# Patient Record
Sex: Female | Born: 1960 | Race: Black or African American | Hispanic: No | Marital: Single | Smoking: Never smoker
Health system: Southern US, Community
[De-identification: ages and names within clinical notes are randomized; demographics above are authoritative.]

## PROBLEM LIST (undated history)

## (undated) DIAGNOSIS — G459 Transient cerebral ischemic attack, unspecified: Secondary | ICD-10-CM

## (undated) DIAGNOSIS — D649 Anemia, unspecified: Secondary | ICD-10-CM

## (undated) DIAGNOSIS — I10 Essential (primary) hypertension: Secondary | ICD-10-CM

## (undated) HISTORY — PX: BREAST BIOPSY: SHX20

## (undated) HISTORY — PX: BREAST EXCISIONAL BIOPSY: SUR124

---

## 2004-02-11 ENCOUNTER — Ambulatory Visit: Payer: Self-pay

## 2005-02-23 ENCOUNTER — Ambulatory Visit: Payer: Self-pay

## 2006-03-08 ENCOUNTER — Ambulatory Visit: Payer: Self-pay

## 2006-12-04 ENCOUNTER — Ambulatory Visit: Payer: Self-pay | Admitting: Family Medicine

## 2007-02-28 ENCOUNTER — Ambulatory Visit: Payer: Self-pay

## 2008-04-02 ENCOUNTER — Emergency Department: Payer: Self-pay | Admitting: Internal Medicine

## 2008-05-27 ENCOUNTER — Ambulatory Visit: Payer: Self-pay

## 2009-09-01 ENCOUNTER — Ambulatory Visit: Payer: Self-pay

## 2010-09-06 ENCOUNTER — Ambulatory Visit: Payer: Self-pay | Admitting: Family Medicine

## 2013-06-09 ENCOUNTER — Emergency Department: Payer: Self-pay | Admitting: Emergency Medicine

## 2013-06-09 LAB — COMPREHENSIVE METABOLIC PANEL
Albumin: 4.1 g/dL (ref 3.4–5.0)
Alkaline Phosphatase: 108 U/L
Anion Gap: 7 (ref 7–16)
BUN: 11 mg/dL (ref 7–18)
Bilirubin,Total: 1.6 mg/dL — ABNORMAL HIGH (ref 0.2–1.0)
Calcium, Total: 9.3 mg/dL (ref 8.5–10.1)
Chloride: 107 mmol/L (ref 98–107)
Co2: 25 mmol/L (ref 21–32)
Creatinine: 1.12 mg/dL (ref 0.60–1.30)
EGFR (African American): >60
EGFR (Non-African Amer.): 56 — ABNORMAL LOW
Glucose: 133 mg/dL — ABNORMAL HIGH (ref 65–99)
Osmolality: 279 (ref 275–301)
Potassium: 3.7 mmol/L (ref 3.5–5.1)
SGOT(AST): 21 U/L (ref 15–37)
SGPT (ALT): 26 U/L (ref 12–78)
Sodium: 139 mmol/L (ref 136–145)
Total Protein: 8.2 g/dL (ref 6.4–8.2)

## 2013-06-09 LAB — CBC WITH DIFFERENTIAL/PLATELET
Basophil #: 0 10*3/uL (ref 0.0–0.1)
Basophil %: 0.2 %
Eosinophil #: 0.1 10*3/uL (ref 0.0–0.7)
Eosinophil %: 0.6 %
HCT: 40.7 % (ref 35.0–47.0)
HGB: 13.5 g/dL (ref 12.0–16.0)
Lymphocyte #: 0.4 10*3/uL — ABNORMAL LOW (ref 1.0–3.6)
Lymphocyte %: 4.2 %
MCH: 29.8 pg (ref 26.0–34.0)
MCHC: 33.2 g/dL (ref 32.0–36.0)
MCV: 90 fL (ref 80–100)
Monocyte #: 0.5 x10 3/mm (ref 0.2–0.9)
Monocyte %: 5.6 %
Neutrophil #: 7.7 10*3/uL — ABNORMAL HIGH (ref 1.4–6.5)
Neutrophil %: 89.4 %
Platelet: 213 10*3/uL (ref 150–440)
RBC: 4.54 10*6/uL (ref 3.80–5.20)
RDW: 12.9 % (ref 11.5–14.5)
WBC: 8.6 10*3/uL (ref 3.6–11.0)

## 2013-06-09 LAB — URINALYSIS, COMPLETE
Bilirubin,UR: NEGATIVE
Blood: NEGATIVE
Glucose,UR: NEGATIVE mg/dL (ref 0–75)
Ketone: NEGATIVE
Nitrite: NEGATIVE
Ph: 6 (ref 4.5–8.0)
Protein: 30
RBC,UR: 4 /HPF (ref 0–5)
Specific Gravity: 1.025 (ref 1.003–1.030)
Squamous Epithelial: 7
WBC UR: 2 /HPF (ref 0–5)

## 2013-06-09 LAB — LIPASE, BLOOD: Lipase: 147 U/L (ref 73–393)

## 2013-06-09 LAB — PREGNANCY, URINE: Pregnancy Test, Urine: NEGATIVE m[IU]/mL

## 2014-02-07 ENCOUNTER — Emergency Department: Payer: Self-pay | Admitting: Emergency Medicine

## 2014-02-13 ENCOUNTER — Emergency Department: Payer: Self-pay | Admitting: Student

## 2014-02-16 ENCOUNTER — Emergency Department: Payer: Self-pay | Admitting: Emergency Medicine

## 2015-10-31 ENCOUNTER — Encounter: Payer: Self-pay | Admitting: Emergency Medicine

## 2015-10-31 ENCOUNTER — Emergency Department: Payer: Self-pay

## 2015-10-31 ENCOUNTER — Emergency Department
Admission: EM | Admit: 2015-10-31 | Discharge: 2015-11-01 | Disposition: A | Discharge status: Home / Self Care | Payer: Self-pay | Attending: Emergency Medicine | Admitting: Emergency Medicine

## 2015-10-31 DIAGNOSIS — R112 Nausea with vomiting, unspecified: Secondary | ICD-10-CM | POA: Insufficient documentation

## 2015-10-31 LAB — COMPREHENSIVE METABOLIC PANEL
ALT: 15 U/L (ref 14–54)
AST: 30 U/L (ref 15–41)
Albumin: 4.2 g/dL (ref 3.5–5.0)
Alkaline Phosphatase: 78 U/L (ref 38–126)
Anion gap: 11 (ref 5–15)
BUN: 16 mg/dL (ref 6–20)
CO2: 19 mmol/L — ABNORMAL LOW (ref 22–32)
Calcium: 9 mg/dL (ref 8.9–10.3)
Chloride: 110 mmol/L (ref 101–111)
Creatinine, Ser: 1 mg/dL (ref 0.44–1.00)
GFR calc Af Amer: >60 mL/min (ref >60)
GFR calc non Af Amer: >60 mL/min (ref >60)
Glucose, Bld: 96 mg/dL (ref 65–99)
Potassium: 4.7 mmol/L (ref 3.5–5.1)
Sodium: 140 mmol/L (ref 135–145)
Total Bilirubin: 1.6 mg/dL — ABNORMAL HIGH (ref 0.3–1.2)
Total Protein: 7.1 g/dL (ref 6.5–8.1)

## 2015-10-31 LAB — URINALYSIS COMPLETE WITH MICROSCOPIC (ARMC ONLY)
Bacteria, UA: NONE SEEN
Bilirubin Urine: NEGATIVE
Glucose, UA: NEGATIVE mg/dL
Nitrite: NEGATIVE
Protein, ur: NEGATIVE mg/dL
Specific Gravity, Urine: 1.017 (ref 1.005–1.030)
pH: 5 (ref 5.0–8.0)

## 2015-10-31 LAB — CBC
HCT: 42.5 % (ref 35.0–47.0)
Hemoglobin: 14.3 g/dL (ref 12.0–16.0)
MCH: 30.4 pg (ref 26.0–34.0)
MCHC: 33.6 g/dL (ref 32.0–36.0)
MCV: 90.4 fL (ref 80.0–100.0)
Platelets: 172 10*3/uL (ref 150–440)
RBC: 4.7 MIL/uL (ref 3.80–5.20)
RDW: 13.4 % (ref 11.5–14.5)
WBC: 9.8 10*3/uL (ref 3.6–11.0)

## 2015-10-31 LAB — LIPASE, BLOOD: Lipase: 28 U/L (ref 11–51)

## 2015-10-31 MED ORDER — SODIUM CHLORIDE 0.9 % IV BOLUS (SEPSIS)
1000.0000 mL | Freq: Once | INTRAVENOUS | Status: AC
  Administered 2015-10-31: 1000 mL via INTRAVENOUS

## 2015-10-31 MED ORDER — ONDANSETRON HCL 4 MG/2ML IJ SOLN
4.0000 mg | Freq: Once | INTRAMUSCULAR | Status: AC
  Administered 2015-10-31: 4 mg via INTRAVENOUS

## 2015-10-31 MED ORDER — METOCLOPRAMIDE HCL 5 MG/ML IJ SOLN
INTRAMUSCULAR | Status: AC
  Administered 2015-10-31: 10 mg via INTRAVENOUS
  Filled 2015-10-31: qty 2

## 2015-10-31 MED ORDER — ONDANSETRON 4 MG PO TBDP: 4.0000 mg | ORAL_TABLET | Freq: Four times a day (QID) | ORAL | Status: DC | PRN

## 2015-10-31 MED ORDER — LORAZEPAM 2 MG/ML IJ SOLN
INTRAMUSCULAR | Status: AC
  Administered 2015-10-31: 0.5 mg via INTRAVENOUS
  Filled 2015-10-31: qty 1

## 2015-10-31 MED ORDER — ONDANSETRON HCL 4 MG/2ML IJ SOLN
INTRAMUSCULAR | Status: AC
  Administered 2015-10-31: 4 mg via INTRAVENOUS
  Filled 2015-10-31: qty 2

## 2015-10-31 MED ORDER — LORAZEPAM 2 MG/ML IJ SOLN
0.5000 mg | INTRAMUSCULAR | Status: AC
  Administered 2015-10-31: 0.5 mg via INTRAVENOUS

## 2015-10-31 MED ORDER — ONDANSETRON HCL 4 MG/2ML IJ SOLN
4.0000 mg | Freq: Once | INTRAMUSCULAR | Status: AC | PRN
  Administered 2015-10-31: 4 mg via INTRAVENOUS
  Filled 2015-10-31: qty 2

## 2015-10-31 MED ORDER — METOCLOPRAMIDE HCL 5 MG/ML IJ SOLN
10.0000 mg | Freq: Once | INTRAMUSCULAR | Status: AC
  Administered 2015-10-31: 10 mg via INTRAVENOUS

## 2015-10-31 NOTE — Discharge Instructions (Signed)
You were seen in the emergency room for vomiting. It is important that you follow up closely with your primary care doctor in the next couple of days.  If you're unable to see her primary care doctor you may return to the emergency room or go to the [facility]-in [facility] in 1 or 2 days for reexam.  Please return to the emergency room right away if you are to develop a fever, severe nausea, your pain becomes severe or worsens, you are unable to keep food down, begin vomiting any dark or bloody fluid, you develop any dark or bloody stools, feel dehydrated, or other new concerns or symptoms arise.   Nausea and Vomiting Nausea is a sick feeling that often comes before throwing up (vomiting). Vomiting is a reflex where stomach contents come out of your mouth. Vomiting can cause severe loss of body fluids (dehydration). Children and elderly adults can become dehydrated quickly, especially if they also have diarrhea. Nausea and vomiting are symptoms of a condition or disease. It is important to find the cause of your symptoms. CAUSES   Direct irritation of the stomach lining. This irritation can result from increased acid production (gastroesophageal reflux disease), infection, food poisoning, taking certain medicines (such as nonsteroidal anti-inflammatory drugs), alcohol use, or tobacco use.  Signals from the brain.These signals could be caused by a headache, heat exposure, an inner ear disturbance, increased pressure in the brain from injury, infection, a tumor, or a concussion, pain, emotional stimulus, or metabolic problems.  An obstruction in the gastrointestinal tract (bowel obstruction).  Illnesses such as diabetes, hepatitis, gallbladder problems, appendicitis, kidney problems, cancer, sepsis, atypical symptoms of a heart attack, or eating disorders.  Medical treatments such as chemotherapy and radiation.  Receiving medicine that makes you sleep (general anesthetic) during  surgery. DIAGNOSIS Your caregiver may ask for tests to be done if the problems do not improve after a few days. Tests may also be done if symptoms are severe or if the reason for the nausea and vomiting is not clear. Tests may include:  Urine tests.  Blood tests.  Stool tests.  Cultures (to look for evidence of infection).  X-rays or other imaging studies. Test results can help your caregiver make decisions about treatment or the need for additional tests. TREATMENT You need to stay well hydrated. Drink frequently but in small amounts.You may wish to drink water, sports drinks, clear broth, or eat frozen ice pops or gelatin dessert to help stay hydrated.When you eat, eating slowly may help prevent nausea.There are also some antinausea medicines that may help prevent nausea. HOME CARE INSTRUCTIONS   Take all medicine as directed by your caregiver.  If you do not have an appetite, do not force yourself to eat. However, you must continue to drink fluids.  If you have an appetite, eat a normal diet unless your caregiver tells you differently.  Eat a variety of complex carbohydrates (rice, wheat, potatoes, bread), lean meats, yogurt, fruits, and vegetables.  Avoid high-fat foods because they are more difficult to digest.  Drink enough water and fluids to keep your urine clear or pale yellow.  If you are dehydrated, ask your caregiver for specific rehydration instructions. Signs of dehydration may include:  Severe thirst.  Dry lips and mouth.  Dizziness.  Dark urine.  Decreasing urine frequency and amount.  Confusion.  Rapid breathing or pulse. SEEK IMMEDIATE MEDICAL CARE IF:   You have blood or brown flecks (like coffee grounds) in your vomit.  You  have black or bloody stools.  You have a severe headache or stiff neck.  You are confused.  You have severe abdominal pain.  You have chest pain or trouble breathing.  You do not urinate at least once every 8  hours.  You develop cold or clammy skin.  You continue to vomit for longer than 24 to 48 hours.  You have a fever. MAKE SURE YOU:   Understand these instructions.  Will watch your condition.  Will get help right away if you are not doing well or get worse.   This information is not intended to replace advice given to you by your health care provider. Make sure you discuss any questions you have with your health care provider.   Document Released: 03/28/2005 Document Revised: 06/20/2011 Document Reviewed: 08/25/2010 Elsevier Interactive Patient Education [employer]

## 2015-10-31 NOTE — ED Provider Notes (Signed)
[facility] Emergency Department Provider Note  ____________________________________________  Time seen: Approximately 8:37 PM  I have reviewed the triage vital signs and the nursing notes.   HISTORY  Chief Complaint Emesis    HPI Lauren Riggs is a 56 y.o. female reports no significant medical history.  Patient reports that she went to talk about today, she didn't ever talk ago and thought it tasted strange so she returned it. About half hour later she started vomiting nonstop.  She reports that she does not have any pain, but just was feeling very very nauseated. Her symptoms are improving now, and she denies any chest pain or trouble breathing. She states that she is vomiting up stomach contents and water. No diarrhea, still able to pass gas normally.  Currently reports minimal nausea, states feeling much better overall. She does feel "dehydrated".   History reviewed. No pertinent past medical history.  There are no active problems to display for this patient.   No past surgical history on file.  Current Outpatient Rx  Name  Route  Sig  Dispense  Refill  . ondansetron (ZOFRAN ODT) 4 MG disintegrating tablet   Oral   Take 1 tablet (4 mg total) by mouth every 6 (six) hours as needed for nausea or vomiting.   20 tablet   0     Allergies Phenergan  No family history on file.  Social History Social History  Substance Use Topics  . Smoking status: Never Smoker   . Smokeless tobacco: Never Used  . Alcohol Use: No    Review of Systems Constitutional: No fever/chills Eyes: No visual changes. ENT: No sore throat. Cardiovascular: Denies chest pain. Respiratory: Denies shortness of breath. Gastrointestinal: No abdominal pain.    No diarrhea.  No constipation. Genitourinary: Negative for dysuria. Musculoskeletal: Negative for back pain. Skin: Negative for rash. Neurological: Negative for headaches, focal weakness or  numbness.  10-point ROS otherwise negative.  ____________________________________________   PHYSICAL EXAM:  VITAL SIGNS: ED Triage Vitals  Enc Vitals Group     BP 10/31/15 1700 159/90 mmHg     Pulse Rate 10/31/15 1700 125     Resp 10/31/15 1700 22     Temp 10/31/15 1700 97.6 F (36.4 C)     Temp Source 10/31/15 1700 Axillary     SpO2 --      Weight 10/31/15 1700 150 lb (68.04 kg)     Height 10/31/15 1700 5\' 4"  (1.626 m)     Head Cir --      Peak Flow --      Pain Score 10/31/15 1700 0     Pain Loc --      Pain Edu? --      Excl. in GC? --    Constitutional: Alert and oriented. Well appearing and in no acute distress.Pleasant. Eyes: Conjunctivae are normal. PERRL. EOMI. Head: Atraumatic. Nose: No congestion/rhinnorhea. Mouth/Throat: Mucous membranes are dry, patient is eating a few ice chips.  Oropharynx non-erythematous. Neck: No stridor.   Cardiovascular: Normal rate, regular rhythm. Grossly normal heart sounds.  Good peripheral circulation. Respiratory: Normal respiratory effort.  No retractions. Lungs CTAB. Gastrointestinal: Soft and nontender. No distention. No distention, negative Murphy, no pain at McBurney's point, no hernias. Patient reports she is having no abdominal pain at this time. Musculoskeletal: No lower extremity tenderness nor edema.  No joint effusions. Neurologic:  Normal speech and language. No gross focal neurologic deficits are appreciated. Skin:  Skin is warm, dry and intact.  No rash noted. Psychiatric: Mood and affect are normal. Speech and behavior are normal.  ____________________________________________   LABS (all labs ordered are listed, but only abnormal results are displayed)  Labs Reviewed  URINALYSIS COMPLETEWITH MICROSCOPIC ([facility] ONLY) - Abnormal; Notable for the following:    Color, Urine YELLOW (*)    APPearance CLEAR (*)    Ketones, ur TRACE (*)    Hgb urine dipstick 1+ (*)    Leukocytes, UA 1+ (*)    Squamous Epithelial / LPF  0-5 (*)    All other components within normal limits  COMPREHENSIVE METABOLIC PANEL - Abnormal; Notable for the following:    CO2 19 (*)    Total Bilirubin 1.6 (*)    All other components within normal limits  CBC  LIPASE, BLOOD   ____________________________________________  EKG  Reviewed and read by me at 2045 Heart rate 90 QRS 90 QTc 420 Normal sinus rhythm, nonspecific T wave abnormality seen in V3, no significant evidence of ischemic abnormality noted.  Note the patient affirms no chest pain. ____________________________________________  RADIOLOGY  DG Abd 2 Views (Final result) Result time: 10/31/15 21:04:39   Final result by Rad Results In Interface (10/31/15 21:04:39)   Narrative:   CLINICAL DATA: Vomiting after eating today.  EXAM: ABDOMEN - 2 VIEW  COMPARISON: None.  FINDINGS: There is no evidence of intraperitoneal free air. Gas and an ordinary amount of stool are present throughout nondilated colon to the level of the rectum. No dilated loops of bowel are seen to suggest obstruction. Tubal ligation clips are noted in the pelvis. Pelvic phleboliths are present. No acute osseous abnormality is seen. The visualized lung bases are grossly clear.  IMPRESSION: Negative.   Electronically Signed By: Sebastian Ache M.D. On: 10/31/2015 21:04    ____________________________________________   PROCEDURES  Procedure(s) performed: None  Critical Care performed: No  ____________________________________________   INITIAL IMPRESSION / ASSESSMENT AND PLAN / ED COURSE  Pertinent labs & imaging results that were available during my care of the patient were reviewed by me and considered in my medical decision making (see chart for details).  Patient answer evaluation of vomiting. Her history seems to be that of having eaten some potentially bad [place], she reported tasted funny and return it to the restaurant and then about 30 minutes later started  vomiting. At the present time she reports her symptoms are better after Zofran and some hydration. She actually feels like she did try drinking some. Overall her abdominal examination is very reassuring. Afebrile, no neurologic cardiac or pulmonary complaints. Labs reviewed and her CO2 is slightly low likely from vomiting, particularly as likely also slightly elevated from vomiting she reports no bowel pain is a negative Murphy and no abdominal pain noted on exam.  We'll give the patient Zofran, continue to hydrate her, plan to reevaluate. I will obtain an x-ray just to rule out bowel obstruction by final likelihood is low.  ----------------------------------------- 11:44 PM on 10/31/2015 -----------------------------------------  Patient feeling improved. She feels very tired after receiving Ativan and Reglan. She does report that she is not having any more nausea and she is no longer vomiting. She is quite somnolent however, but resting comfortably with stable vital signs. We will continue to monitor her, plan is to observe her for about another hour, and if improved once alert and feeling well likely discharge to home. Discussed careful return precautions.  Ongoing plan of care assigned to Dr. Lenard Lance. Reevaluate the patient about one hour for improvement in  her nausea, we continue to observe her. Her vital signs are much improved and I suspect she likely had food poisoning or self limited gastritis, however we'll give her careful return precautions. Continue to monitor as she has become fairly somnolent after receiving Ativan and Reglan, however her nausea improved rapidly. ____________________________________________   FINAL CLINICAL IMPRESSION(S) / ED DIAGNOSES  Final diagnoses:  Non-intractable vomiting with nausea, vomiting of unspecified type      Sharyn Creamer, MD 10/31/15 2346

## 2015-10-31 NOTE — ED Notes (Signed)
States went to [employer] today at noon and ate a taco and has been vomiting ever since.

## 2015-11-01 MED ORDER — LOPERAMIDE HCL 2 MG PO CAPS
4.0000 mg | ORAL_CAPSULE | Freq: Once | ORAL | Status: AC
  Administered 2015-11-01: 4 mg via ORAL
  Filled 2015-11-01: qty 2

## 2015-11-01 NOTE — ED Notes (Signed)
See paper charting due to downtime  

## 2016-06-19 ENCOUNTER — Encounter: Payer: Self-pay | Admitting: Emergency Medicine

## 2016-06-19 DIAGNOSIS — R232 Flushing: Secondary | ICD-10-CM | POA: Insufficient documentation

## 2016-06-19 NOTE — ED Triage Notes (Signed)
Patient brought in by ems from home. Patient has elevated blood pressure after eating pork tonight. Patient with a history of the same after pork. Per ems bp 182/105, heart rate 69 SR, 100% on room air and fsbs 86.

## 2016-06-20 ENCOUNTER — Emergency Department
Admission: EM | Admit: 2016-06-20 | Discharge: 2016-06-20 | Disposition: A | Discharge status: Home / Self Care | Payer: Self-pay | Attending: Emergency Medicine | Admitting: Emergency Medicine

## 2016-06-20 DIAGNOSIS — R232 Flushing: Secondary | ICD-10-CM

## 2016-06-20 NOTE — ED Provider Notes (Signed)
[facility] Emergency Department Provider Note  ____________________________________________  Time seen: Approximately 12:47 AM  I have reviewed the triage vital signs and the nursing notes.   HISTORY  Chief Complaint Hypertension    HPI Lauren Riggs is a 56 y.o. female who reports elevated blood pressure hot flash dizziness and near syncope today after eating some pork. This is happened to her once before after eating pork. It last about 15 minutes and then resolved. EMS reported an initial blood pressure 180/100. Patient states she is feeling much better, blood pressure is 150/80 now. She feels like she is back to normal. She does report that her diet is very poor and she mostly's potato chips and other processed foods. I counseled her on this. She'll follow-up with her primary care doctor as well. She wishes to be discharged from the emergency Department now because she feels just fine.     History reviewed. No pertinent past medical history.   There are no active problems to display for this patient.    History reviewed. No pertinent surgical history.   Prior to Admission medications   Medication Sig Start Date End Date Taking? Authorizing Provider  ondansetron (ZOFRAN ODT) 4 MG disintegrating tablet Take 1 tablet (4 mg total) by mouth every 6 (six) hours as needed for nausea or vomiting. 10/31/15   Sharyn Creamer, MD     Allergies Phenergan [promethazine hcl]   No family history on file.  Social History Social History  Substance Use Topics  . Smoking status: Never Smoker  . Smokeless tobacco: Never Used  . Alcohol use No    Review of Systems  Constitutional:   No fever or chills.  ENT:   No sore throat. No rhinorrhea. Cardiovascular:   No chest pain. Respiratory:   No dyspnea or cough. Gastrointestinal:   Negative for abdominal pain, vomiting and diarrhea.  Genitourinary:   Negative for dysuria or difficulty  urinating. Musculoskeletal:   Negative for focal pain or swelling Neurological:   Negative for headaches 10-point ROS otherwise negative.  ____________________________________________   PHYSICAL EXAM:  VITAL SIGNS: ED Triage Vitals [06/19/16 2308]  Enc Vitals Group     BP (!) 154/91     Pulse Rate 64     Resp 18     Temp 97.7 F (36.5 C)     Temp Source Oral     SpO2 98 %     Weight 145 lb (65.8 kg)     Height 5\' 4"  (1.626 m)     Head Circumference      Peak Flow      Pain Score      Pain Loc      Pain Edu?      Excl. in GC?     Vital signs reviewed, nursing assessments reviewed.   Constitutional:   Alert and oriented. Well appearing and in no distress. Eyes:   No scleral icterus. No conjunctival pallor. PERRL. EOMI.  No nystagmus. ENT   Head:   Normocephalic and atraumatic.   Nose:   No congestion/rhinnorhea. No septal hematoma   Mouth/Throat:   MMM, no pharyngeal erythema. No peritonsillar mass.    Neck:   No stridor. No SubQ emphysema. No meningismus. Hematological/Lymphatic/Immunilogical:   No cervical lymphadenopathy. Cardiovascular:   RRR. Symmetric bilateral radial and DP pulses.  No murmurs.  Respiratory:   Normal respiratory effort without tachypnea nor retractions. Breath sounds are clear and equal bilaterally. No wheezes/rales/rhonchi. Gastrointestinal:   Soft and  nontender. Non distended. There is no CVA tenderness.  No rebound, rigidity, or guarding. Genitourinary:   deferred Musculoskeletal:   Normal range of motion in all extremities. No joint effusions.  No lower extremity tenderness.  No edema. Neurologic:   Normal speech and language.  CN 2-10 normal. Motor grossly intact. No gross focal neurologic deficits are appreciated.  Skin:    Skin is warm, dry and intact. No rash noted.  No petechiae, purpura, or bullae.  ____________________________________________    LABS (pertinent positives/negatives) (all labs ordered are listed, but  only abnormal results are displayed) Labs Reviewed - No data to display ____________________________________________   EKG    ____________________________________________    RADIOLOGY  No results found.  ____________________________________________   PROCEDURES Procedures  ____________________________________________   INITIAL IMPRESSION / ASSESSMENT AND PLAN / ED COURSE  Pertinent labs & imaging results that were available during my care of the patient were reviewed by me and considered in my medical decision making (see chart for details).  Well-appearing no acute distress, presents with an episode of feeling hot and flushed and dizzy after eating pork. Likely a foodborne toxin exposure. She is well-appearing no acute distress at baseline. We'll discharge her have her follow-up with primary care for recheck of her blood pressure. Counseled her on diet modification. Return precautions given.         ____________________________________________   FINAL CLINICAL IMPRESSION(S) / ED DIAGNOSES  Final diagnoses:  Flushing reaction      New Prescriptions   No medications on file     Portions of this note were generated with dragon dictation software. Dictation errors may occur despite best attempts at proofreading.    Sharman CheekPhillip Aquarius Tremper, MD 06/20/16 [phone]

## 2016-06-20 NOTE — ED Notes (Signed)
Patient had episode of hot flash, dizziness and near syncope today after eating pork. Pt reports similar episodes in the past. Pt denies current symptoms.

## 2016-06-22 ENCOUNTER — Encounter: Payer: Self-pay | Admitting: *Deleted

## 2016-06-22 ENCOUNTER — Ambulatory Visit: Payer: Self-pay | Attending: Oncology | Admitting: *Deleted

## 2016-06-22 ENCOUNTER — Ambulatory Visit
Admission: RE | Admit: 2016-06-22 | Discharge: 2016-06-22 | Disposition: A | Discharge status: Home / Self Care | Payer: Self-pay | Source: Ambulatory Visit | Attending: Oncology | Admitting: Oncology

## 2016-06-22 VITALS — BP 136/74 | HR 64 | Temp 97.4°F | Ht 63.78 in | Wt 144.2 lb

## 2016-06-22 DIAGNOSIS — Z0000 Encounter for general adult medical examination without abnormal findings: Secondary | ICD-10-CM

## 2016-06-22 NOTE — Progress Notes (Signed)
Subjective:     Patient ID: Lauren Riggs, female   DOB: 01-Nov-1960, 56 y.o.   MRN: [id number]  HPI   Review of Systems     Objective:   Physical Exam  Pulmonary/Chest:         Assessment:     56 year [facility] Black [facility].  Clinical breast exam unremarkable.  Taught self breast awareness.  Last pap was one week ago.  Patient has been screened for eligibility.  She does not have any insurance, Medicare or Medicaid.  She also meets financial eligibility.  Hand-out given on the Affordable Care Act.    Plan:     Screening mammogram ordered.  Will follow-up per [facility] protocol.

## 2016-06-22 NOTE — Patient Instructions (Signed)
Gave patient hand-out, Women Staying Healthy, Active and Well from [facility], with education on breast health, pap smears, heart and colon health. 

## 2016-06-29 ENCOUNTER — Encounter: Payer: Self-pay | Admitting: *Deleted

## 2016-06-29 NOTE — Progress Notes (Signed)
Letter mailed from the [facility] to inform patient of her normal mammogram results.  Patient is to follow-up with annual screening in one year.  [facility] to Christy. 

## 2017-09-18 ENCOUNTER — Ambulatory Visit: Payer: Self-pay | Attending: Oncology

## 2017-09-18 ENCOUNTER — Encounter (INDEPENDENT_AMBULATORY_CARE_PROVIDER_SITE_OTHER): Payer: Self-pay

## 2017-09-18 ENCOUNTER — Ambulatory Visit
Admission: RE | Admit: 2017-09-18 | Discharge: 2017-09-18 | Disposition: A | Discharge status: Home / Self Care | Payer: Self-pay | Source: Ambulatory Visit | Attending: Oncology | Admitting: Oncology

## 2017-09-18 VITALS — BP 132/84 | HR 54 | Temp 97.7°F | Ht 65.0 in | Wt 146.0 lb

## 2017-09-18 DIAGNOSIS — Z0000 Encounter for general adult medical examination without abnormal findings: Secondary | ICD-10-CM | POA: Insufficient documentation

## 2017-09-18 NOTE — Progress Notes (Signed)
  Subjective:     Patient ID: Lauren Riggs, female   DOB: Apr 05, 1961, 57 y.o.   MRN: [id number]  HPI   Review of Systems     Objective:   Physical Exam  Pulmonary/Chest: Right breast exhibits no inverted nipple, no mass, no nipple discharge, no skin change and no tenderness. Left breast exhibits no inverted nipple, no mass, no nipple discharge, no skin change and no tenderness. Breasts are symmetrical.       Assessment:     57 [facility].  Patient screened, and meets [facility] eligibility.  Patient does not have insurance, Medicare or Medicaid.  Handout given on Affordable Care Act.   Instructed patient on breast self awareness using teach back method.  Clinical breast exam unremarkable.  No mass or lump palpated.  [place]  Went to [employer], and now has grandchild at the same school.    Plan:     Sent for bilateral screening mammogram.

## 2017-09-18 NOTE — Progress Notes (Signed)
Sent for bilateral screening mammogram.  Letter mailed from [facility] to notify of normal mammogram results.  Patient to return in one year for annual screening.  Copy to [facility]IS. 

## 2017-12-02 ENCOUNTER — Emergency Department: Payer: Self-pay

## 2017-12-02 ENCOUNTER — Other Ambulatory Visit: Payer: Self-pay

## 2017-12-02 ENCOUNTER — Emergency Department
Admission: EM | Admit: 2017-12-02 | Discharge: 2017-12-02 | Disposition: A | Discharge status: Home / Self Care | Payer: Self-pay | Attending: Emergency Medicine | Admitting: Emergency Medicine

## 2017-12-02 DIAGNOSIS — G459 Transient cerebral ischemic attack, unspecified: Secondary | ICD-10-CM | POA: Insufficient documentation

## 2017-12-02 DIAGNOSIS — Z79899 Other long term (current) drug therapy: Secondary | ICD-10-CM | POA: Insufficient documentation

## 2017-12-02 LAB — URINALYSIS, COMPLETE (UACMP) WITH MICROSCOPIC
Bilirubin Urine: NEGATIVE
Glucose, UA: NEGATIVE mg/dL
Ketones, ur: NEGATIVE mg/dL
Nitrite: NEGATIVE
Protein, ur: NEGATIVE mg/dL
Specific Gravity, Urine: 1.004 — ABNORMAL LOW (ref 1.005–1.030)
pH: 7 (ref 5.0–8.0)

## 2017-12-02 LAB — COMPREHENSIVE METABOLIC PANEL
ALT: 17 U/L (ref 0–44)
AST: 29 U/L (ref 15–41)
Albumin: 4.5 g/dL (ref 3.5–5.0)
Alkaline Phosphatase: 92 U/L (ref 38–126)
Anion gap: 7 (ref 5–15)
BUN: 12 mg/dL (ref 6–20)
CO2: 25 mmol/L (ref 22–32)
Calcium: 9.4 mg/dL (ref 8.9–10.3)
Chloride: 107 mmol/L (ref 98–111)
Creatinine, Ser: 0.93 mg/dL (ref 0.44–1.00)
GFR calc Af Amer: >60 mL/min (ref >60)
GFR calc non Af Amer: >60 mL/min (ref >60)
Glucose, Bld: 96 mg/dL (ref 70–99)
Potassium: 4.3 mmol/L (ref 3.5–5.1)
Sodium: 139 mmol/L (ref 135–145)
Total Bilirubin: 1.1 mg/dL (ref 0.3–1.2)
Total Protein: 7.8 g/dL (ref 6.5–8.1)

## 2017-12-02 LAB — CBC
HCT: 39.2 % (ref 35.0–47.0)
Hemoglobin: 13.2 g/dL (ref 12.0–16.0)
MCH: 30.9 pg (ref 26.0–34.0)
MCHC: 33.7 g/dL (ref 32.0–36.0)
MCV: 91.7 fL (ref 80.0–100.0)
Platelets: 258 10*3/uL (ref 150–440)
RBC: 4.28 MIL/uL (ref 3.80–5.20)
RDW: 13.3 % (ref 11.5–14.5)
WBC: 6.9 10*3/uL (ref 3.6–11.0)

## 2017-12-02 LAB — TROPONIN I: Troponin I: <0.03 ng/mL (ref <0.03)

## 2017-12-02 MED ORDER — HYDROCHLOROTHIAZIDE 12.5 MG PO CAPS: 12.5000 mg | ORAL_CAPSULE | Freq: Every day | ORAL | 0 refills | Status: DC

## 2017-12-02 MED ORDER — ASPIRIN 81 MG PO CHEW
324.0000 mg | CHEWABLE_TABLET | Freq: Once | ORAL | Status: AC
  Administered 2017-12-02: 324 mg via ORAL
  Filled 2017-12-02: qty 4

## 2017-12-02 NOTE — Discharge Instructions (Signed)
Please begin taking a baby aspirin every day and take your blood pressure medication as prescribed.  Follow-up with your primary care physician this coming week for a recheck and return to the emergency department for any concerns whatsoever.  It was a pleasure to take care of you today, and thank you for coming to our emergency department.  If you have any questions or concerns before leaving please ask the nurse to grab me and I'm more than happy to go through your aftercare instructions again.  If you were prescribed any opioid pain medication today such as Norco, Vicodin, Percocet, morphine, hydrocodone, or oxycodone please make sure you do not drive when you are taking this medication as it can alter your ability to drive safely.  If you have any concerns once you are home that you are not improving or are in fact getting worse before you can make it to your follow-up appointment, please do not hesitate to call 911 and come back for further evaluation.  Merrily Brittle, MD  Results for orders placed or performed during the hospital encounter of 12/02/17  CBC  Result Value Ref Range   WBC 6.9 3.6 - 11.0 K/uL   RBC 4.28 3.80 - 5.20 MIL/uL   Hemoglobin 13.2 12.0 - 16.0 g/dL   HCT 16.1 09.6 - 04.5 %   MCV 91.7 80.0 - 100.0 fL   MCH 30.9 26.0 - 34.0 pg   MCHC 33.7 32.0 - 36.0 g/dL   RDW 40.9 81.1 - 91.4 %   Platelets 258 150 - 440 K/uL  Urinalysis, Complete w Microscopic  Result Value Ref Range   Color, Urine STRAW (A) YELLOW   APPearance CLEAR (A) CLEAR   Specific Gravity, Urine 1.004 (L) 1.005 - 1.030   pH 7.0 5.0 - 8.0   Glucose, UA NEGATIVE NEGATIVE mg/dL   Hgb urine dipstick SMALL (A) NEGATIVE   Bilirubin Urine NEGATIVE NEGATIVE   Ketones, ur NEGATIVE NEGATIVE mg/dL   Protein, ur NEGATIVE NEGATIVE mg/dL   Nitrite NEGATIVE NEGATIVE   Leukocytes, UA MODERATE (A) NEGATIVE   RBC / HPF 0-5 0 - 5 RBC/hpf   WBC, UA 0-5 0 - 5 WBC/hpf   Bacteria, UA RARE (A) NONE SEEN   Squamous  Epithelial / LPF 0-5 0 - 5   Mucus PRESENT   Troponin I  Result Value Ref Range   Troponin I <0.03 <0.03 ng/mL  Comprehensive metabolic panel  Result Value Ref Range   Sodium 139 135 - 145 mmol/L   Potassium 4.3 3.5 - 5.1 mmol/L   Chloride 107 98 - 111 mmol/L   CO2 25 22 - 32 mmol/L   Glucose, Bld 96 70 - 99 mg/dL   BUN 12 6 - 20 mg/dL   Creatinine, Ser 7.82 0.44 - 1.00 mg/dL   Calcium 9.4 8.9 - 95.6 mg/dL   Total Protein 7.8 6.5 - 8.1 g/dL   Albumin 4.5 3.5 - 5.0 g/dL   AST 29 15 - 41 U/L   ALT 17 0 - 44 U/L   Alkaline Phosphatase 92 38 - 126 U/L   Total Bilirubin 1.1 0.3 - 1.2 mg/dL   GFR calc non Af Amer >60 >60 mL/min   GFR calc Af Amer >60 >60 mL/min   Anion gap 7 5 - 15   Ct Head Wo Contrast  Result Date: 12/02/2017 CLINICAL DATA:  Vertigo. EXAM: CT HEAD WITHOUT CONTRAST TECHNIQUE: Contiguous axial images were obtained from the base of the skull through the vertex  without intravenous contrast. COMPARISON:  None. FINDINGS: Brain: No subdural, epidural, or subarachnoid hemorrhage. Cerebellum, brainstem, and basal cisterns are normal. Ventricles and sulci are unremarkable. No acute cortical ischemia or infarct. No mass effect or midline shift. Vascular: No hyperdense vessel or unexpected calcification. Skull: Normal. Negative for fracture or focal lesion. Sinuses/Orbits: No acute finding. Other: None. IMPRESSION: 1. No acute intracranial abnormalities. Electronically Signed   By: Gerome Samavid  Williams III M.D   On: 12/02/2017 18:53

## 2017-12-02 NOTE — ED Provider Notes (Signed)
[facility] Emergency Department Provider Note  ____________________________________________   First MD Initiated Contact with Patient 12/02/17 1748     (approximate)  I have reviewed the triage vital signs and the nursing notes.   HISTORY  Chief Complaint Dizziness   HPI Lauren Riggs is a 57 y.o. female comes to the emergency department with about a 15-minute episode of abnormal vision on the lateral aspect of her left eye.  She said she was driving in her car when she felt like the lateral aspect of her left vision was "spinning".  She had no numbness or weakness.  Her vision was not double it was "spinning".  No headache.  No history of stroke coronary artery disease or peripheral artery disease.  Her symptoms came on suddenly and passed quickly on their own.  Nothing in particular seemed to make them better or worse.    No past medical history on file.  There are no active problems to display for this patient.   Past Surgical History:  Procedure Laterality Date  . BREAST BIOPSY Left    neg    Prior to Admission medications   Medication Sig Start Date End Date Taking? Authorizing Provider  hydrochlorothiazide (MICROZIDE) 12.5 MG capsule Take 1 capsule (12.5 mg total) by mouth daily. 12/02/17 12/02/18  Merrily Brittle, MD  ondansetron (ZOFRAN ODT) 4 MG disintegrating tablet Take 1 tablet (4 mg total) by mouth every 6 (six) hours as needed for nausea or vomiting. 10/31/15   Sharyn Creamer, MD    Allergies Phenergan [promethazine hcl]  No family history on file.  Social History Social History   Tobacco Use  . Smoking status: Never Smoker  . Smokeless tobacco: Never Used  Substance Use Topics  . Alcohol use: No  . Drug use: No    Review of Systems Constitutional: No fever/chills Eyes: Positive for visual change ENT: No sore throat. Cardiovascular: Denies chest pain. Respiratory: Denies shortness of breath. Gastrointestinal: No  abdominal pain.  No nausea, no vomiting.  No diarrhea.  No constipation. Genitourinary: Negative for dysuria. Musculoskeletal: Negative for back pain. Skin: Negative for rash. Neurological: Negative for headaches   ____________________________________________   PHYSICAL EXAM:  VITAL SIGNS: ED Triage Vitals  Enc Vitals Group     BP 12/02/17 1526 (!) 181/97     Pulse Rate 12/02/17 1526 61     Resp 12/02/17 1526 20     Temp 12/02/17 1526 98.6 F (37 C)     Temp Source 12/02/17 1526 Oral     SpO2 12/02/17 1526 100 %     Weight 12/02/17 1527 145 lb (65.8 kg)     Height 12/02/17 1527 5\' 2"  (1.575 m)     Head Circumference --      Peak Flow --      Pain Score 12/02/17 1527 0     Pain Loc --      Pain Edu? --      Excl. in GC? --     Constitutional: Alert and oriented x4 pleasant cooperative speaks in full clear sentences no diaphoresis Eyes: PERRL EOMI. pupils are midrange and brisk.  20/20 bilaterally.  Full visual fields Head: Atraumatic. Nose: No congestion/rhinnorhea. Mouth/Throat: No trismus Neck: No stridor.   Cardiovascular: Normal rate, regular rhythm. Grossly normal heart sounds.  Good peripheral circulation. Respiratory: Normal respiratory effort.  No retractions. Lungs CTAB and moving good air Gastrointestinal: Soft nontender Musculoskeletal: No lower extremity edema   Neurologic:  Normal speech and  language. No gross focal neurologic deficits are appreciated. Skin:  Skin is warm, dry and intact. No rash noted. Psychiatric: Mood and affect are normal. Speech and behavior are normal.    ____________________________________________   DIFFERENTIAL includes but not limited to  Stroke, TIA, retinal detachment ____________________________________________   LABS (all labs ordered are listed, but only abnormal results are displayed)  Labs Reviewed  URINALYSIS, COMPLETE (UACMP) WITH MICROSCOPIC - Abnormal; Notable for the following components:      Result Value     Color, Urine STRAW (*)    APPearance CLEAR (*)    Specific Gravity, Urine 1.004 (*)    Hgb urine dipstick SMALL (*)    Leukocytes, UA MODERATE (*)    Bacteria, UA RARE (*)    All other components within normal limits  CBC  TROPONIN I  COMPREHENSIVE METABOLIC PANEL    Lab work reviewed by me with no acute disease __________________________________________  EKG  ED ECG REPORT I, Merrily BrittleNeil Brande Uncapher, the attending physician, personally viewed and interpreted this ECG.  Date: 12/03/2017 EKG Time:  Rate: 60 Rhythm: normal sinus rhythm QRS Axis: normal Intervals: normal ST/T Wave abnormalities: normal Narrative Interpretation: no evidence of acute ischemia  ____________________________________________  RADIOLOGY  Head CT reviewed by me with no acute disease ____________________________________________   PROCEDURES  Procedure(s) performed: no  Procedures  Critical Care performed: no  ____________________________________________   INITIAL IMPRESSION / ASSESSMENT AND PLAN / ED COURSE  Pertinent labs & imaging results that were available during my care of the patient were reviewed by me and considered in my medical decision making (see chart for details).   As part of my medical decision making, I reviewed the following data within the electronic MEDICAL RECORD NUMBER History obtained from family if available, nursing notes, old chart and ekg, as well as notes from prior ED visits.  The patient had a brief episode of abnormal vision on the lateral aspect of her left eye.  Unclear if this represented a palsy versus a TIA etc.  Head CT is pending.  Patient was kept on monitor multiple hours with no ectopy.  Head CT is reassuring and her neuro exam is intact.  This very well could have represented a transient ischemic attack.  Given a first aspirin here.  I discussed with the patient inpatient admission versus outpatient management and she is comfortable being managed as an  outpatient which I think is actually entirely reasonable.  We will begin her on gentle antihypertensives for her elevated blood pressure here and help her reestablish care with primary care.  Strict return precautions have been given.      ____________________________________________   FINAL CLINICAL IMPRESSION(S) / ED DIAGNOSES  Final diagnoses:  TIA (transient ischemic attack)      NEW MEDICATIONS STARTED DURING THIS VISIT:  Discharge Medication List as of 12/02/2017  7:05 PM    START taking these medications   Details  hydrochlorothiazide (MICROZIDE) 12.5 MG capsule Take 1 capsule (12.5 mg total) by mouth daily., Starting Sat 12/02/2017, Until Sun 12/02/2018, Print         Note:  This document was prepared using Dragon voice recognition software and may include unintentional dictation errors.     Merrily Brittleifenbark, Khyle Goodell, MD 12/03/17 1723

## 2017-12-02 NOTE — ED Triage Notes (Signed)
States approx 1 hour ago while driving car felt like room spinning. Smile symmetrical, grips and leg strength equal, denies headache or chest pain.

## 2017-12-15 ENCOUNTER — Encounter: Payer: Self-pay | Admitting: Emergency Medicine

## 2017-12-15 ENCOUNTER — Emergency Department
Admission: EM | Admit: 2017-12-15 | Discharge: 2017-12-15 | Disposition: A | Discharge status: Home / Self Care | Payer: Self-pay | Attending: Emergency Medicine | Admitting: Emergency Medicine

## 2017-12-15 ENCOUNTER — Other Ambulatory Visit: Payer: Self-pay

## 2017-12-15 DIAGNOSIS — Z8673 Personal history of transient ischemic attack (TIA), and cerebral infarction without residual deficits: Secondary | ICD-10-CM | POA: Insufficient documentation

## 2017-12-15 DIAGNOSIS — I10 Essential (primary) hypertension: Secondary | ICD-10-CM | POA: Insufficient documentation

## 2017-12-15 DIAGNOSIS — Z79899 Other long term (current) drug therapy: Secondary | ICD-10-CM | POA: Insufficient documentation

## 2017-12-15 DIAGNOSIS — R42 Dizziness and giddiness: Secondary | ICD-10-CM | POA: Insufficient documentation

## 2017-12-15 HISTORY — DX: Transient cerebral ischemic attack, unspecified: G45.9

## 2017-12-15 HISTORY — DX: Essential (primary) hypertension: I10

## 2017-12-15 LAB — CBC WITH DIFFERENTIAL/PLATELET
Basophils Absolute: 0 10*3/uL (ref 0–0.1)
Basophils Relative: 1 %
Eosinophils Absolute: 0.2 10*3/uL (ref 0–0.7)
Eosinophils Relative: 3 %
HCT: 37.1 % (ref 35.0–47.0)
Hemoglobin: 12.7 g/dL (ref 12.0–16.0)
Lymphocytes Relative: 31 %
Lymphs Abs: 1.6 10*3/uL (ref 1.0–3.6)
MCH: 30.9 pg (ref 26.0–34.0)
MCHC: 34.2 g/dL (ref 32.0–36.0)
MCV: 90.4 fL (ref 80.0–100.0)
Monocytes Absolute: 0.5 10*3/uL (ref 0.2–0.9)
Monocytes Relative: 9 %
Neutro Abs: 2.8 10*3/uL (ref 1.4–6.5)
Neutrophils Relative %: 56 %
Platelets: 242 10*3/uL (ref 150–440)
RBC: 4.11 MIL/uL (ref 3.80–5.20)
RDW: 13 % (ref 11.5–14.5)
WBC: 5.1 10*3/uL (ref 3.6–11.0)

## 2017-12-15 LAB — URINALYSIS, COMPLETE (UACMP) WITH MICROSCOPIC
Bilirubin Urine: NEGATIVE
Glucose, UA: NEGATIVE mg/dL
Hgb urine dipstick: NEGATIVE
Ketones, ur: NEGATIVE mg/dL
Leukocytes, UA: NEGATIVE
Nitrite: NEGATIVE
Protein, ur: NEGATIVE mg/dL
Specific Gravity, Urine: 1.003 — ABNORMAL LOW (ref 1.005–1.030)
pH: 6 (ref 5.0–8.0)

## 2017-12-15 LAB — COMPREHENSIVE METABOLIC PANEL
ALT: 28 U/L (ref 0–44)
AST: 28 U/L (ref 15–41)
Albumin: 4.3 g/dL (ref 3.5–5.0)
Alkaline Phosphatase: 89 U/L (ref 38–126)
Anion gap: 9 (ref 5–15)
BUN: 16 mg/dL (ref 6–20)
CO2: 26 mmol/L (ref 22–32)
Calcium: 9.3 mg/dL (ref 8.9–10.3)
Chloride: 103 mmol/L (ref 98–111)
Creatinine, Ser: 1.04 mg/dL — ABNORMAL HIGH (ref 0.44–1.00)
GFR calc Af Amer: >60 mL/min (ref >60)
GFR calc non Af Amer: 58 mL/min — ABNORMAL LOW (ref >60)
Glucose, Bld: 98 mg/dL (ref 70–99)
Potassium: 3.4 mmol/L — ABNORMAL LOW (ref 3.5–5.1)
Sodium: 138 mmol/L (ref 135–145)
Total Bilirubin: 1.5 mg/dL — ABNORMAL HIGH (ref 0.3–1.2)
Total Protein: 7.6 g/dL (ref 6.5–8.1)

## 2017-12-15 LAB — GLUCOSE, CAPILLARY: Glucose-Capillary: 90 mg/dL (ref 70–99)

## 2017-12-15 MED ORDER — AMLODIPINE BESYLATE 5 MG PO TABS: 5.0000 mg | ORAL_TABLET | Freq: Every day | ORAL | 1 refills | Status: AC

## 2017-12-15 NOTE — ED Notes (Signed)
Pt unhooked to go to the bathroom at this time. Pt refused assistance from this RN.

## 2017-12-15 NOTE — ED Provider Notes (Signed)
[facility] Emergency Department Provider Note   ____________________________________________    I have reviewed the triage vital signs and the nursing notes.   HISTORY  Chief Complaint Dizziness    HPI Lauren Riggs is a 57 y.o. female who presents with complaints of dizziness.  Patient reports seen in the emergency department approximately 1 week ago after an episode of dizziness and put on hydrochlorothiazide.  She reports that this medication has made her feel "foggy "and that she has a dry mouth all the time.  She also reports she had another episode of dizziness this morning which has since resolved, she describes it as a sensation of the room moving.  No weakness or numbness or headache or other symptoms.  No falls  Past Medical History:  Diagnosis Date  . HTN (hypertension)   . TIA (transient ischemic attack)     There are no active problems to display for this patient.   Past Surgical History:  Procedure Laterality Date  . BREAST BIOPSY Left    neg    Prior to Admission medications   Medication Sig Start Date End Date Taking? Authorizing Provider  amLODipine (NORVASC) 5 MG tablet Take 1 tablet (5 mg total) by mouth daily. 12/15/17 12/15/18  Jene Every, MD  ondansetron (ZOFRAN ODT) 4 MG disintegrating tablet Take 1 tablet (4 mg total) by mouth every 6 (six) hours as needed for nausea or vomiting. Patient not taking: Reported on 12/15/2017 10/31/15   Sharyn Creamer, MD     Allergies Phenergan [promethazine hcl]  History reviewed. No pertinent family history.  Social History Social History   Tobacco Use  . Smoking status: Never Smoker  . Smokeless tobacco: Never Used  Substance Use Topics  . Alcohol use: No  . Drug use: No    Review of Systems  Constitutional: No fever/chills Eyes: No visual changes.  ENT: No sore throat. Cardiovascular: Denies chest pain. Respiratory: Denies shortness of breath. Gastrointestinal: No  abdominal pain.  No nausea, no vomiting.   Genitourinary: Negative for dysuria. Musculoskeletal: Negative for back pain. Skin: Negative for rash. Neurological: Negative for headaches or weakness   ____________________________________________   PHYSICAL EXAM:  VITAL SIGNS: ED Triage Vitals  Enc Vitals Group     BP 12/15/17 1230 (!) 161/95     Pulse Rate 12/15/17 1230 72     Resp 12/15/17 1230 11     Temp 12/15/17 1234 98.3 F (36.8 C)     Temp Source 12/15/17 1234 Oral     SpO2 12/15/17 1227 99 %     Weight 12/15/17 1231 65.8 kg (145 lb)     Height 12/15/17 1231 1.575 m (5\' 2" )     Head Circumference --      Peak Flow --      Pain Score 12/15/17 1231 0     Pain Loc --      Pain Edu? --      Excl. in GC? --     Constitutional: Alert and oriented. No acute distress.   Nose: No congestion/rhinnorhea. Mouth/Throat: Mucous membranes are moist.    Cardiovascular: Normal rate, regular rhythm. Grossly normal heart sounds.  Good peripheral circulation. Respiratory: Normal respiratory effort.  No retractions.  Gastrointestinal: Soft and nontender. No distention.    Musculoskeletal: No lower extremity tenderness nor edema.  Warm and well perfused Neurologic:  Normal speech and language. No gross focal neurologic deficits are appreciated.  Reassuring neuro exam Skin:  Skin is warm,  dry and intact.  Psychiatric: Mood and affect are normal. Speech and behavior are normal.  ____________________________________________   LABS (all labs ordered are listed, but only abnormal results are displayed)  Labs Reviewed  COMPREHENSIVE METABOLIC PANEL - Abnormal; Notable for the following components:      Result Value   Potassium 3.4 (*)    Creatinine, Ser 1.04 (*)    Total Bilirubin 1.5 (*)    GFR calc non Af Amer 58 (*)    All other components within normal limits  URINALYSIS, COMPLETE (UACMP) WITH MICROSCOPIC - Abnormal; Notable for the following components:   Color, Urine  COLORLESS (*)    APPearance CLEAR (*)    Specific Gravity, Urine 1.003 (*)    Bacteria, UA RARE (*)    All other components within normal limits  GLUCOSE, CAPILLARY  CBC WITH DIFFERENTIAL/PLATELET   ____________________________________________  EKG  ED ECG REPORT I, Jene Every, the attending physician, personally viewed and interpreted this ECG.  Date: 12/15/2017  Rhythm: normal sinus rhythm QRS Axis: normal Intervals: normal ST/T Wave abnormalities: normal Narrative Interpretation: no evidence of acute ischemia  ____________________________________________  RADIOLOGY  None ____________________________________________   PROCEDURES  Procedure(s) performed: No  Procedures   Critical Care performed: No ____________________________________________   INITIAL IMPRESSION / ASSESSMENT AND PLAN / ED COURSE  Pertinent labs & imaging results that were available during my care of the patient were reviewed by me and considered in my medical decision making (see chart for details).  Patient well-appearing in no acute distress.  She seems to be complaining primarily of possible medication side effect from hydrochlorothiazide.  Does describe a possible episode of vertigo however this is resolved.  Quite comfortable today but complaining of dry mouth.  Labs are reassuring.  EKG is unremarkable.  Normal neuro exam.  Discussed with patient that we will stop her hydrochlorothiazide and start her on amlodipine, she is to follow-up with PCP.  She feels quite comfort with this plan and is ready to go home    ____________________________________________   FINAL CLINICAL IMPRESSION(S) / ED DIAGNOSES  Final diagnoses:  Dizziness        Note:  This document was prepared using Dragon voice recognition software and may include unintentional dictation errors.    Jene Every, MD 12/15/17 [phone]

## 2017-12-15 NOTE — ED Triage Notes (Signed)
Pt presents to ED via [facility] with c/o medication reaction. EMS reports initially called out for possible stroke, however on arrival pt had negative stroke screen, EMS reports last week was dx with TIA and was recently placed on HCTZ, bottle filled 12/03/17. EMS reports patient took medication this morning and 3 hrs later felt like her HR was racing and like she did "last time she had a stroke". On arrival to ED pt is alert and oriented, = grip strength, facial symmetry intact, denies numbness at this time.

## 2017-12-15 NOTE — ED Notes (Signed)
NAD noted at time of D/C. Pt denies questions or concerns. Pt ambulatory to the lobby at this time.  Pt refused wheelchair to the lobby at this time.  

## 2017-12-15 NOTE — ED Notes (Signed)
This RN assisted patient to the bathroom, attempting to collect UA at this time.

## 2017-12-21 ENCOUNTER — Emergency Department
Admission: EM | Admit: 2017-12-21 | Discharge: 2017-12-21 | Disposition: A | Discharge status: Home / Self Care | Payer: Self-pay | Attending: Emergency Medicine | Admitting: Emergency Medicine

## 2017-12-21 DIAGNOSIS — R61 Generalized hyperhidrosis: Secondary | ICD-10-CM | POA: Insufficient documentation

## 2017-12-21 DIAGNOSIS — R42 Dizziness and giddiness: Secondary | ICD-10-CM | POA: Insufficient documentation

## 2017-12-21 DIAGNOSIS — N3000 Acute cystitis without hematuria: Secondary | ICD-10-CM | POA: Insufficient documentation

## 2017-12-21 DIAGNOSIS — I10 Essential (primary) hypertension: Secondary | ICD-10-CM | POA: Insufficient documentation

## 2017-12-21 LAB — URINALYSIS, COMPLETE (UACMP) WITH MICROSCOPIC
Bilirubin Urine: NEGATIVE
Glucose, UA: NEGATIVE mg/dL
Hgb urine dipstick: NEGATIVE
Ketones, ur: NEGATIVE mg/dL
Nitrite: NEGATIVE
Protein, ur: NEGATIVE mg/dL
Specific Gravity, Urine: 1.015 (ref 1.005–1.030)
pH: 7 (ref 5.0–8.0)

## 2017-12-21 LAB — CBC
HCT: 38.2 % (ref 35.0–47.0)
Hemoglobin: 13 g/dL (ref 12.0–16.0)
MCH: 30.9 pg (ref 26.0–34.0)
MCHC: 34.1 g/dL (ref 32.0–36.0)
MCV: 90.5 fL (ref 80.0–100.0)
Platelets: 274 10*3/uL (ref 150–440)
RBC: 4.21 MIL/uL (ref 3.80–5.20)
RDW: 13.5 % (ref 11.5–14.5)
WBC: 7.1 10*3/uL (ref 3.6–11.0)

## 2017-12-21 LAB — TROPONIN I: Troponin I: <0.03 ng/mL (ref <0.03)

## 2017-12-21 LAB — BASIC METABOLIC PANEL
Anion gap: 7 (ref 5–15)
BUN: 14 mg/dL (ref 6–20)
CO2: 25 mmol/L (ref 22–32)
Calcium: 9.7 mg/dL (ref 8.9–10.3)
Chloride: 108 mmol/L (ref 98–111)
Creatinine, Ser: 0.99 mg/dL (ref 0.44–1.00)
GFR calc Af Amer: >60 mL/min (ref >60)
GFR calc non Af Amer: >60 mL/min (ref >60)
Glucose, Bld: 104 mg/dL — ABNORMAL HIGH (ref 70–99)
Potassium: 4.4 mmol/L (ref 3.5–5.1)
Sodium: 140 mmol/L (ref 135–145)

## 2017-12-21 LAB — TSH: TSH: 1.527 u[IU]/mL (ref 0.350–4.500)

## 2017-12-21 LAB — GLUCOSE, CAPILLARY: Glucose-Capillary: 90 mg/dL (ref 70–99)

## 2017-12-21 MED ORDER — CEPHALEXIN 500 MG PO CAPS: 500.0000 mg | ORAL_CAPSULE | Freq: Four times a day (QID) | ORAL | 0 refills | Status: AC

## 2017-12-21 MED ORDER — SODIUM CHLORIDE 0.9 % IV BOLUS
1000.0000 mL | Freq: Once | INTRAVENOUS | Status: AC
  Administered 2017-12-21: 1000 mL via INTRAVENOUS

## 2017-12-21 MED ORDER — SODIUM CHLORIDE 0.9 % IV SOLN
1.0000 g | Freq: Once | INTRAVENOUS | Status: AC
  Administered 2017-12-21: 1 g via INTRAVENOUS
  Filled 2017-12-21: qty 10

## 2017-12-21 NOTE — Discharge Instructions (Addendum)
Please drink plenty of fluid to stay well-hydrated.  Please take the entire course of antibiotics, even if you are feeling better.  Please follow-up with your primary care physician.  A thyroid test has been ordered today, which your primary care physician can also follow-up on.  Return to the emergency department if you develop severe pain, lightheadedness or fainting, fever, inability to keep down fluids, or any other symptoms concerning to you.

## 2017-12-21 NOTE — ED Provider Notes (Signed)
[facility] Emergency Department Provider Note  ____________________________________________  Time seen: Approximately 9:52 PM  I have reviewed the triage vital signs and the nursing notes.   HISTORY  Chief Complaint Weakness    HPI Lauren Riggs is a 57 y.o. female with a history of HTN and TIA presenting for lightheadedness and sweatiness.  The patient reports that several months ago she had a TIA and since then "I have not felt right."  She reports generalized weakness.  She was seen here 9/6 for similar symptoms and her HCTZ was changed for amlodipine.  Today, she went for routine follow-up with her primary care physician and she was started on Zoloft for anxiety but has not taken the first tablet.  Today, she was walking in the store when she began to feel lightheaded and sweaty; she did not have any chest pain, shortness of breath, palpitations or syncope.  She has not had any recent cough or cold symptoms, fever or chills, nausea vomiting or diarrhea.  At this time, she is feeling back to baseline.  Past Medical History:  Diagnosis Date  . HTN (hypertension)   . TIA (transient ischemic attack)     There are no active problems to display for this patient.   Past Surgical History:  Procedure Laterality Date  . BREAST BIOPSY Left    neg    Current Outpatient Rx  . Order #: [id number] Class: Print  . Order #: [id number] Class: Normal  . Order #: [id number] Class: Print    Allergies Phenergan [promethazine hcl]  No family history on file.  Social History Social History   Tobacco Use  . Smoking status: Never Smoker  . Smokeless tobacco: Never Used  Substance Use Topics  . Alcohol use: No  . Drug use: No    Review of Systems Constitutional: No fever/chills.  Positive lightheadedness without syncope.  Positive diaphoresis. Eyes: No visual changes. ENT: No sore throat. No congestion or rhinorrhea.  No ear pain. Cardiovascular: Denies chest  pain. Denies palpitations. Respiratory: Denies shortness of breath.  No cough. Gastrointestinal: No abdominal pain.  No nausea, no vomiting.  No diarrhea.  No constipation. Genitourinary: Negative for dysuria. Musculoskeletal: Negative for back pain. Skin: Negative for rash. Neurological: Negative for headaches. No focal numbness, tingling or weakness.  No visual or speech changes.  No mental status changes.  No gait instability.    ____________________________________________   PHYSICAL EXAM:  VITAL SIGNS: ED Triage Vitals  Enc Vitals Group     BP 12/21/17 2028 138/70     Pulse Rate 12/21/17 2028 75     Resp 12/21/17 2028 18     Temp 12/21/17 2028 98.7 F (37.1 C)     Temp Source 12/21/17 2028 Oral     SpO2 12/21/17 2028 99 %     Weight 12/21/17 2026 148 lb (67.1 kg)     Height 12/21/17 2026 5\' 2"  (1.575 m)     Head Circumference --      Peak Flow --      Pain Score 12/21/17 2026 0     Pain Loc --      Pain Edu? --      Excl. in GC? --     Constitutional: Alert and oriented. Answers questions appropriately. Eyes: Conjunctivae are normal.  EOMI. No scleral icterus. Head: Atraumatic. Nose: No congestion/rhinnorhea. Mouth/Throat: Mucous membranes are mildly dry.  Neck: No stridor.  Supple.  No JVD.  No meningismus peer Cardiovascular: Normal rate, regular rhythm. No  murmurs, rubs or gallops.  Respiratory: Normal respiratory effort.  No accessory muscle use or retractions. Lungs CTAB.  No wheezes, rales or ronchi. Gastrointestinal: Soft, nontender and nondistended.  No guarding or rebound.  No peritoneal signs. Musculoskeletal: No LE edema. Neurologic:  A&Ox3.  Speech is clear.  Face and smile are symmetric.  EOMI.  Moves all extremities well. Skin:  Skin is warm, dry and intact. No rash noted. Psychiatric: It is depressed and affect is flat.  ____________________________________________   LABS (all labs ordered are listed, but only abnormal results are  displayed)  Labs Reviewed  BASIC METABOLIC PANEL - Abnormal; Notable for the following components:      Result Value   Glucose, Bld 104 (*)    All other components within normal limits  URINALYSIS, COMPLETE (UACMP) WITH MICROSCOPIC - Abnormal; Notable for the following components:   Color, Urine YELLOW (*)    APPearance HAZY (*)    Leukocytes, UA LARGE (*)    Bacteria, UA FEW (*)    All other components within normal limits  URINE CULTURE  CBC  TROPONIN I  GLUCOSE, CAPILLARY  TSH  CBG MONITORING, ED   ____________________________________________  EKG  ED ECG REPORT I, Anne-Caroline Sharma Covert, the attending physician, personally viewed and interpreted this ECG.   Date: 12/21/2017  EKG Time: 2028  Rate: 75  Rhythm: normal sinus rhythm  Axis: leftward  Intervals:none  ST&T Change: No STEMI  ____________________________________________  RADIOLOGY  No results found.  ____________________________________________   PROCEDURES  Procedure(s) performed: None  Procedures  Critical Care performed: No ____________________________________________   INITIAL IMPRESSION / ASSESSMENT AND PLAN / ED COURSE  Pertinent labs & imaging results that were available during my care of the patient were reviewed by me and considered in my medical decision making (see chart for details).  57 y.o. female with a history of TIA and hypertension presenting for a lightheaded episode with diaphoresis.  Overall, the patient is hemodynamically stable and afebrile.  I have done a full neurologic examination and do not see any acute neurologic deficits.  Vertigo is unlikely given that it was a lightheaded feeling and not a room spinning sensation.  It is possible she is having side effects to her new medications, including her antihypertensives.  Her urine does also show signs of an infection, which could have caused her symptoms.  She was received Rocephin here, and be discharged home with a 5-day  course of antibiotics.  The remainder of her laboratory studies are reassuring without any electrolyte disturbance, severe anemia, and her EKG does not show ischemic changes; her troponin is negative.  At this time, the patient is safe for discharge home and I have discussed follow-up instructions as well as return precautions with her.  ____________________________________________  FINAL CLINICAL IMPRESSION(S) / ED DIAGNOSES  Final diagnoses:  Acute cystitis without hematuria  Lightheadedness  Diaphoresis         NEW MEDICATIONS STARTED DURING THIS VISIT:  New Prescriptions   CEPHALEXIN (KEFLEX) 500 MG CAPSULE    Take 1 capsule (500 mg total) by mouth 4 (four) times daily for 5 days.      Rockne Menghini, MD 12/21/17 2205

## 2017-12-21 NOTE — ED Triage Notes (Signed)
Patient c/o weakness, dizziness, and diaphoresis. Patient reports TIA 2 weeks ago.

## 2017-12-23 LAB — URINE CULTURE

## 2018-03-13 IMAGING — CR DG ABDOMEN 2V
3 series · 3 of 3 positions shown · non-contrast
Comparison: None.

CLINICAL DATA: Vomiting after eating today.

EXAM:
ABDOMEN - 2 VIEW

[abdomen erect (1 of 2)]
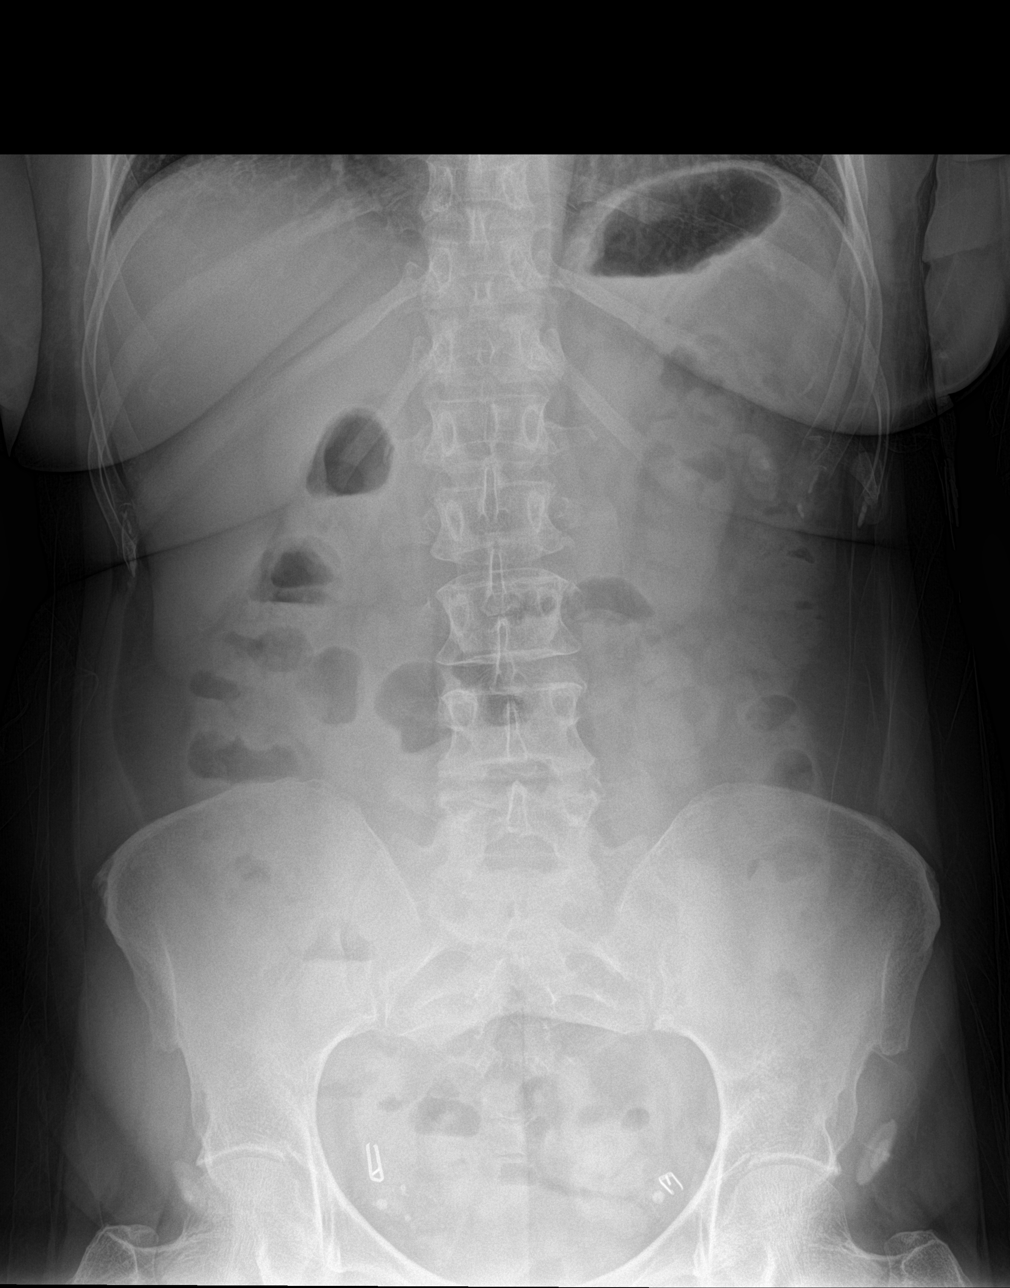

[abdomen erect (2 of 2)]
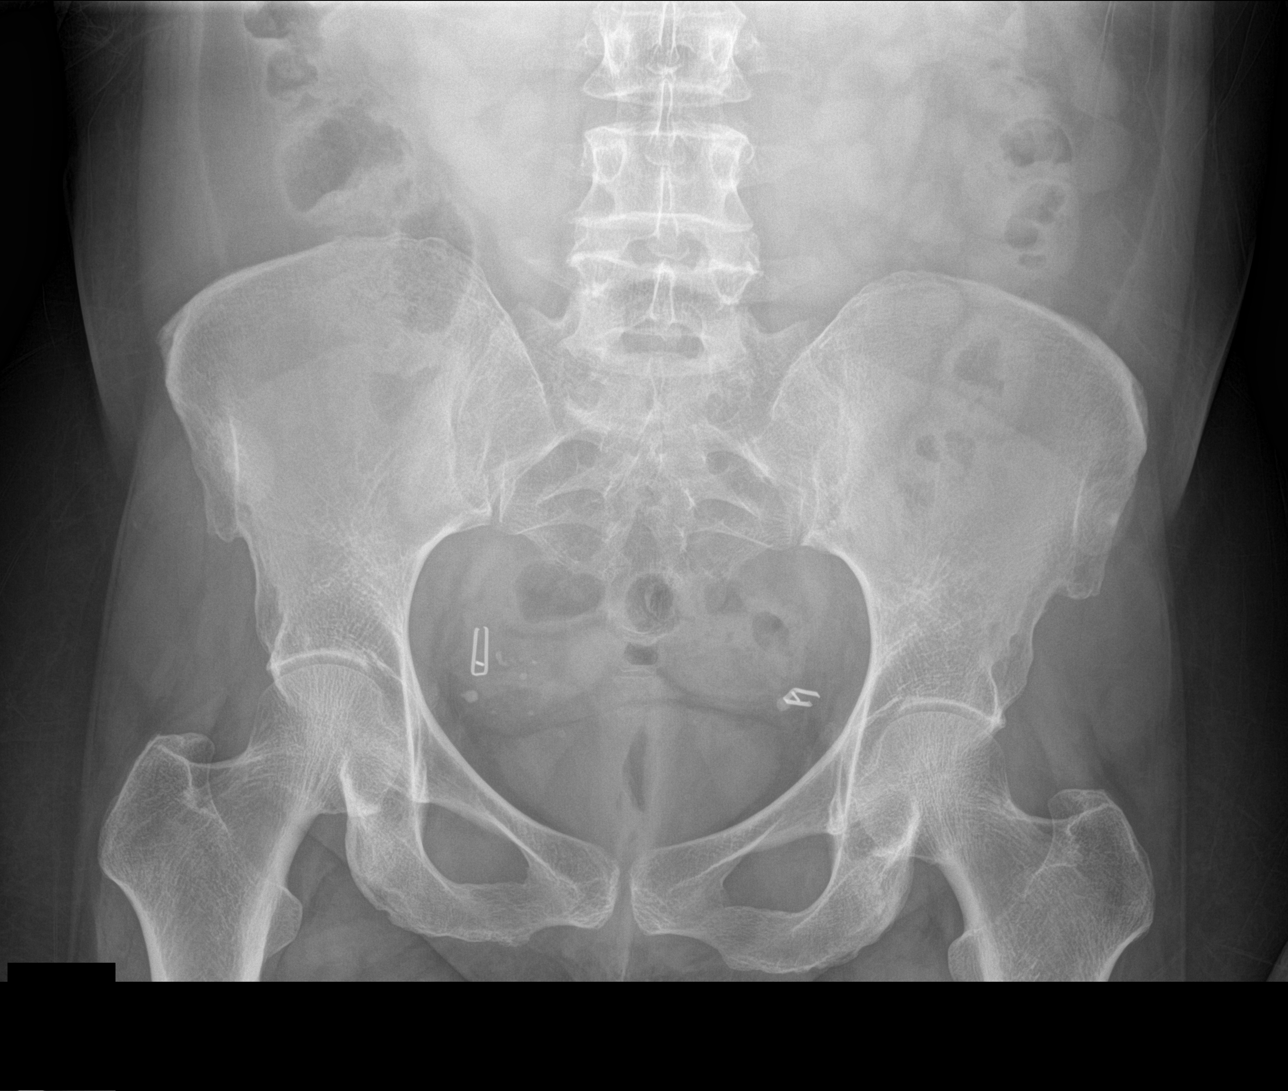

[abdomen supine]
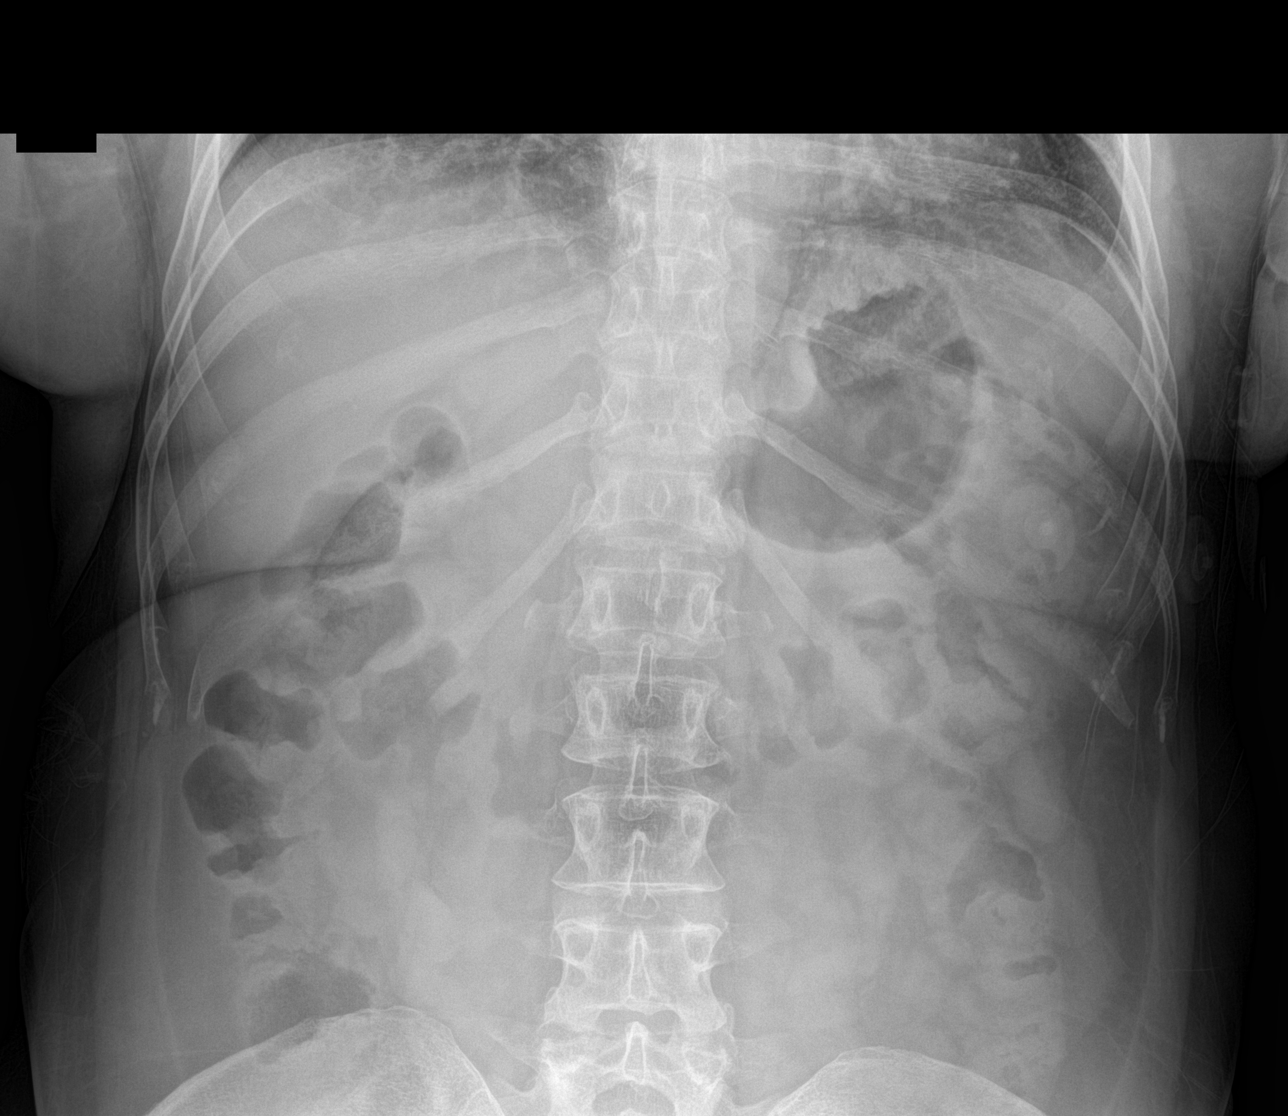

[3 of 3 positions shown; findings below may reference images not displayed]

FINDINGS: There is no evidence of intraperitoneal free air. Gas and an
ordinary amount of stool are present throughout nondilated colon to
the level of the rectum. No dilated loops of bowel are seen to
suggest obstruction. Tubal ligation clips are noted in the pelvis.
Pelvic phleboliths are present. No acute osseous abnormality is
seen. The visualized lung bases are grossly clear.
IMPRESSION: Negative.

## 2019-07-29 ENCOUNTER — Other Ambulatory Visit: Payer: Self-pay

## 2019-07-29 ENCOUNTER — Ambulatory Visit (LOCAL_COMMUNITY_HEALTH_CENTER): Payer: Medicaid Other

## 2019-07-29 DIAGNOSIS — Z111 Encounter for screening for respiratory tuberculosis: Secondary | ICD-10-CM

## 2019-08-01 ENCOUNTER — Ambulatory Visit (LOCAL_COMMUNITY_HEALTH_CENTER): Payer: Medicaid Other

## 2019-08-01 ENCOUNTER — Other Ambulatory Visit: Payer: Self-pay

## 2019-08-01 DIAGNOSIS — Z111 Encounter for screening for respiratory tuberculosis: Secondary | ICD-10-CM

## 2019-08-01 LAB — TB SKIN TEST
Induration: 0 mm
TB Skin Test: NEGATIVE

## 2020-08-03 ENCOUNTER — Ambulatory Visit (LOCAL_COMMUNITY_HEALTH_CENTER): Payer: Medicaid Other

## 2020-08-03 ENCOUNTER — Other Ambulatory Visit: Payer: Self-pay

## 2020-08-03 DIAGNOSIS — Z111 Encounter for screening for respiratory tuberculosis: Secondary | ICD-10-CM

## 2020-08-06 ENCOUNTER — Ambulatory Visit (LOCAL_COMMUNITY_HEALTH_CENTER): Payer: Medicaid Other

## 2020-08-06 ENCOUNTER — Other Ambulatory Visit: Payer: Self-pay

## 2020-08-06 DIAGNOSIS — Z111 Encounter for screening for respiratory tuberculosis: Secondary | ICD-10-CM

## 2020-08-06 LAB — TB SKIN TEST
Induration: 0 mm
TB Skin Test: NEGATIVE

## 2021-07-13 ENCOUNTER — Emergency Department
Admission: EM | Admit: 2021-07-13 | Discharge: 2021-07-13 | Disposition: A | Discharge status: Home / Self Care | Payer: Self-pay | Attending: Emergency Medicine | Admitting: Emergency Medicine

## 2021-07-13 ENCOUNTER — Other Ambulatory Visit: Payer: Self-pay

## 2021-07-13 ENCOUNTER — Emergency Department: Payer: Self-pay

## 2021-07-13 DIAGNOSIS — W19XXXA Unspecified fall, initial encounter: Secondary | ICD-10-CM | POA: Insufficient documentation

## 2021-07-13 DIAGNOSIS — S93492A Sprain of other ligament of left ankle, initial encounter: Secondary | ICD-10-CM | POA: Insufficient documentation

## 2021-07-13 MED ORDER — HYDROCODONE-ACETAMINOPHEN 5-325 MG PO TABS
1.0000 | ORAL_TABLET | Freq: Once | ORAL | Status: AC
  Administered 2021-07-13: 1 via ORAL
  Filled 2021-07-13: qty 1

## 2021-07-13 NOTE — ED Notes (Signed)
Ace wrap applied. Patient given crutches and educated on how to use. No questions for RN at this time ?

## 2021-07-13 NOTE — ED Triage Notes (Signed)
Pt comes with c/o left ankle pain. Pt states she fell this am. ?

## 2021-07-13 NOTE — ED Notes (Signed)
See triage note  presents with left ankle pain  states she slipped on curb this am twisted ankle  pt is able to bear some wt   good pulses  no deformity noted ?

## 2021-07-13 NOTE — ED Provider Notes (Signed)
? ?[facility] ?Provider Note ? ?Patient Contact: 4:57 PM (approximate) ? ? ?History  ? ?Ankle Pain ? ? ?HPI ? ?Lauren Riggs is a 61 y.o. female presents to the emergency department after an inversion type ankle injury.  Patient states that she missed a concrete step.  She had some difficulty bearing weight since injury occurred and states that she is not can be able to work [employer].  Denies hitting her head or neck.  States that she does have some numbness and tingling in her toes. ? ?  ? ? ?Physical Exam  ? ?Triage Vital Signs: ?ED Triage Vitals  ?Enc Vitals Group  ?   BP 07/13/21 1507 129/83  ?   Pulse Rate 07/13/21 1507 84  ?   Resp 07/13/21 1507 18  ?   Temp 07/13/21 1507 99 ?F (37.2 ?C)  ?   Temp Source 07/13/21 1507 Oral  ?   SpO2 07/13/21 1507 97 %  ?   Weight 07/13/21 1633 147 lb 14.9 oz (67.1 kg)  ?   Height 07/13/21 1633 5\' 2"  (1.575 m)  ?   Head Circumference --   ?   Peak Flow --   ?   Pain Score 07/13/21 1504 10  ?   Pain Loc --   ?   Pain Edu? --   ?   Excl. in GC? --   ? ? ?Most recent vital signs: ?Vitals:  ? 07/13/21 1507  ?BP: 129/83  ?Pulse: 84  ?Resp: 18  ?Temp: 99 ?F (37.2 ?C)  ?SpO2: 97%  ? ? ? ?General: Alert and in no acute distress. ?Eyes:  PERRL. EOMI. ?Head: No acute traumatic findings ?ENT: ?     Ears:  ?     Nose: No congestion/rhinnorhea. ?     Mouth/Throat: Mucous membranes are moist. ?Neck: No stridor. No cervical spine tenderness to palpation. ?Cardiovascular:  Good peripheral perfusion ?Respiratory: Normal respiratory effort without tachypnea or retractions. Lungs CTAB. Good air entry to the bases with no decreased or absent breath sounds. ?Gastrointestinal: Bowel sounds ?4 quadrants. Soft and nontender to palpation. No guarding or rigidity. No palpable masses. No distention. No CVA tenderness. ?Musculoskeletal: Full range of motion to all extremities.  Patient has tenderness to palpation over the anterior and posterior talofibular ligaments.  Palpable  dorsalis pedis pulse, left. ?Neurologic:  No gross focal neurologic deficits are appreciated.  ?Skin:   No rash noted ?Other: ? ? ?ED Results / Procedures / Treatments  ? ?Labs ?(all labs ordered are listed, but only abnormal results are displayed) ?Labs Reviewed - No data to display ? ? ? ? ? ?RADIOLOGY ? ?I personally viewed and evaluated these images as part of my medical decision making, as well as reviewing the written report by the radiologist. ? ?ED Provider Interpretation: No acute bony abnormality was visualized.  I personally reviewed x-ray and agree with radiologist interpretation. ? ? ?PROCEDURES: ? ?Critical Care performed: No ? ?Procedures ? ? ?MEDICATIONS ORDERED IN ED: ?Medications  ?HYDROcodone-acetaminophen (NORCO/VICODIN) 5-325 MG per tablet 1 tablet (has no administration in time range)  ? ? ? ?IMPRESSION / MDM / ASSESSMENT AND PLAN / ED COURSE  ?I reviewed the triage vital signs and the nursing notes. ?             ?               ? ?Assessment and plan ?Ankle pain ?Differential diagnosis includes, but is not limited to,  ankle sprain versus fracture ?61 year old female presents to the emergency department with inversion type ankle injury.  No acute bony abnormality was visualized on x-ray.  Ace wrap was applied and crutches were provided.  Patient already takes a daily aspirin so we will hold off on anti-inflammatories for now.  Recommended 650 mg of Tylenol every 6 hours, rest, ice, compression and elevation and follow-up with podiatry as needed.  Patient was given 1 tablet of Norco in the emergency department prior to discharge. ? ? ?FINAL CLINICAL IMPRESSION(S) / ED DIAGNOSES  ? ?Final diagnoses:  ?Sprain of anterior talofibular ligament of left ankle, initial encounter  ? ? ? ?Rx / DC Orders  ? ?ED Discharge Orders   ? ? None  ? ?  ? ? ? ?Note:  This document was prepared using Dragon voice recognition software and may include unintentional dictation errors. ?  ?Orvil Feil,  PA-C ?07/13/21 1703 ? ?  ?Merwyn Katos, MD ?07/13/21 1855 ? ?

## 2021-07-13 NOTE — Discharge Instructions (Addendum)
You can take 650 mg of Tylenol every 6 hours as needed for pain. 

## 2021-07-18 ENCOUNTER — Emergency Department: Payer: No Typology Code available for payment source

## 2021-07-18 ENCOUNTER — Other Ambulatory Visit: Payer: Self-pay

## 2021-07-18 ENCOUNTER — Emergency Department
Admission: EM | Admit: 2021-07-18 | Discharge: 2021-07-18 | Disposition: A | Discharge status: Home / Self Care | Payer: No Typology Code available for payment source | Attending: Emergency Medicine | Admitting: Emergency Medicine

## 2021-07-18 DIAGNOSIS — M545 Low back pain, unspecified: Secondary | ICD-10-CM

## 2021-07-18 DIAGNOSIS — Y92410 Unspecified street and highway as the place of occurrence of the external cause: Secondary | ICD-10-CM | POA: Diagnosis not present

## 2021-07-18 DIAGNOSIS — I10 Essential (primary) hypertension: Secondary | ICD-10-CM | POA: Insufficient documentation

## 2021-07-18 DIAGNOSIS — S3992XA Unspecified injury of lower back, initial encounter: Secondary | ICD-10-CM | POA: Insufficient documentation

## 2021-07-18 LAB — URINALYSIS, ROUTINE W REFLEX MICROSCOPIC
Bilirubin Urine: NEGATIVE
Glucose, UA: NEGATIVE mg/dL
Ketones, ur: NEGATIVE mg/dL
Leukocytes,Ua: NEGATIVE
Nitrite: NEGATIVE
Protein, ur: NEGATIVE mg/dL
Specific Gravity, Urine: 1.028 (ref 1.005–1.030)
pH: 5 (ref 5.0–8.0)

## 2021-07-18 MED ORDER — ACETAMINOPHEN 500 MG PO TABS
1000.0000 mg | ORAL_TABLET | Freq: Once | ORAL | Status: AC
  Administered 2021-07-18: 1000 mg via ORAL
  Filled 2021-07-18: qty 2

## 2021-07-18 MED ORDER — LIDOCAINE 5 % EX PTCH: 1.0000 | MEDICATED_PATCH | Freq: Two times a day (BID) | CUTANEOUS | 0 refills | Status: AC

## 2021-07-18 MED ORDER — METHOCARBAMOL 500 MG PO TABS: 500.0000 mg | ORAL_TABLET | Freq: Three times a day (TID) | ORAL | 0 refills | Status: AC
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
## 2021-07-18 NOTE — Discharge Instructions (Addendum)
-  Take Tylenol/ibuprofen as needed for pain.  You may additionally utilize methocarbamol and lidocaine patches as needed.  Use caution when taking methocarbamol as this may cause drowsiness. ?-Follow-up with your primary care provider as needed. ?-Return to the emergency department anytime if you begin to experience any new or worsening symptoms. ?

## 2021-08-09 ENCOUNTER — Ambulatory Visit (LOCAL_COMMUNITY_HEALTH_CENTER): Payer: Self-pay

## 2021-08-09 DIAGNOSIS — Z111 Encounter for screening for respiratory tuberculosis: Secondary | ICD-10-CM

## 2021-08-12 ENCOUNTER — Ambulatory Visit (LOCAL_COMMUNITY_HEALTH_CENTER): Payer: Self-pay

## 2021-08-12 DIAGNOSIS — Z111 Encounter for screening for respiratory tuberculosis: Secondary | ICD-10-CM

## 2021-08-12 LAB — TB SKIN TEST
Induration: 0 mm
TB Skin Test: NEGATIVE

## 2022-02-02 ENCOUNTER — Ambulatory Visit: Payer: Medicaid Other

## 2022-02-03 ENCOUNTER — Ambulatory Visit: Payer: Self-pay | Admitting: Nurse Practitioner

## 2022-02-03 DIAGNOSIS — G459 Transient cerebral ischemic attack, unspecified: Secondary | ICD-10-CM | POA: Insufficient documentation

## 2022-02-03 DIAGNOSIS — Z113 Encounter for screening for infections with a predominantly sexual mode of transmission: Secondary | ICD-10-CM

## 2022-02-03 DIAGNOSIS — I10 Essential (primary) hypertension: Secondary | ICD-10-CM | POA: Insufficient documentation

## 2022-02-03 LAB — WET PREP FOR TRICH, YEAST, CLUE
Trichomonas Exam: NEGATIVE
Yeast Exam: NEGATIVE

## 2022-02-03 NOTE — Progress Notes (Signed)
Pt seen for STI screening. Seen by FNP White. Initial results reviewed with Pt.

## 2022-02-03 NOTE — Progress Notes (Signed)
[facility] Department  STI clinic/screening visit [place] [address] [phone]  Subjective:  Lauren Riggs is a 61 y.o. female being seen today for an STI screening visit. The patient reports they do have symptoms.  Patient reports that they do not desire a pregnancy in the next year.   They reported they are not interested in discussing contraception today.    No LMP recorded. Patient is postmenopausal.   Patient has the following medical conditions:   Patient Active Problem List   Diagnosis Date Noted   HTN (hypertension) 02/03/2022    Chief Complaint  Patient presents with   SEXUALLY TRANSMITTED DISEASE    Vaginal odor. Resolved yesterday    HPI  Patient reports to clinic today for STD screening.  Patient reports a discharge and odor that began one week ago.    Does the patient using douching products? No  Last HIV test per patient/review of record was: unsure  Patient reports last pap was: 2018  Screening for MPX risk: Does the patient have an unexplained rash? No Is the patient MSM? No Does the patient endorse multiple sex partners or anonymous sex partners? No Did the patient have close or sexual contact with a person diagnosed with MPX? No Has the patient traveled outside the [place] where MPX is endemic? No Is there a high clinical suspicion for MPX-- evidenced by one of the following No  -Unlikely to be chickenpox  -Lymphadenopathy  -Rash that present in same phase of evolution on any given body part See flowsheet for further details and programmatic requirements.   Immunization history:  Immunization History  Administered Date(s) Administered   PPD Test 07/29/2019, 08/03/2020, 08/09/2021     The following portions of the patient's history were reviewed and updated as appropriate: allergies, current medications, past medical history, past social history, past surgical history and problem list.  Objective:  There  were no vitals filed for this visit.  Physical Exam Constitutional:      Appearance: Normal appearance.  HENT:     Head: Normocephalic. No abrasion, masses or laceration. Hair is normal.     Right Ear: External ear normal.     Left Ear: External ear normal.     Nose: Nose normal.     Mouth/Throat:     Lips: Pink.     Mouth: Mucous membranes are moist. No oral lesions.     Pharynx: No oropharyngeal exudate or posterior oropharyngeal erythema.     Tonsils: No tonsillar exudate or tonsillar abscesses.  Eyes:     General: Lids are normal.        Right eye: No discharge.        Left eye: No discharge.     Conjunctiva/sclera: Conjunctivae normal.     Right eye: No exudate.    Left eye: No exudate. Abdominal:     General: Abdomen is flat.     Palpations: Abdomen is soft.     Tenderness: There is no abdominal tenderness. There is no rebound.  Genitourinary:    Pubic Area: No rash or pubic lice.      Labia:        Right: No rash, tenderness, lesion or injury.        Left: No rash, tenderness, lesion or injury.      Vagina: Normal. No vaginal discharge, erythema or lesions.     Cervix: No cervical motion tenderness, discharge, lesion or erythema.     Uterus: Not  enlarged and not tender.      Rectum: Normal.     Comments: Amount Discharge: small Odor: No pH: less than 4.5 Adheres to vaginal wall: No Color: color of discharge matches the Lyndsay Talamante swab Musculoskeletal:     Cervical back: Full passive range of motion without pain, normal range of motion and neck supple.  Lymphadenopathy:     Cervical: No cervical adenopathy.     Right cervical: No superficial, deep or posterior cervical adenopathy.    Left cervical: No superficial, deep or posterior cervical adenopathy.     Upper Body:     Right upper body: No supraclavicular, axillary or epitrochlear adenopathy.     Left upper body: No supraclavicular, axillary or epitrochlear adenopathy.     Lower Body: No right inguinal adenopathy.  No left inguinal adenopathy.  Skin:    Findings: No lesion or rash.  Neurological:     Mental Status: She is alert.  Psychiatric:        Behavior: Behavior is cooperative.      Assessment and Plan:  Lauren Riggs is a 61 y.o. female presenting to the [facility] Department for STI screening  1. Screening examination for venereal disease -60 year old female in clinic today or STD screening. -Patient accepted all screenings including vaginal CT/GC, wet prep and declines bloodwork for HIV/RPR.  Patient meets criteria for HepB screening? No. Ordered? No - low risk  Patient meets criteria for HepC screening? No. Ordered? No - low risk   Treat wet prep per standing order Discussed time line for [facility] results and that patient will be called with positive results and encouraged patient to call if she had not heard in 2 weeks.  Counseled to return or seek care for continued or worsening symptoms Recommended condom use with all sex  Patient is currently not using  contraception  to prevent pregnancy.  Patient is postmenopausal.    - Chlamydia/Gonorrhea [place] [facility] - WET PREP FOR TRICH, YEAST, CLUE    Total time spent: 30 minutes   Return if symptoms worsen or fail to improve.   Glenna Fellows, FNP

## 2022-03-08 ENCOUNTER — Encounter: Payer: Self-pay | Admitting: Emergency Medicine

## 2022-03-08 ENCOUNTER — Emergency Department
Admission: EM | Admit: 2022-03-08 | Discharge: 2022-03-08 | Disposition: A | Discharge status: Home / Self Care | Payer: Medicaid Other | Attending: Emergency Medicine | Admitting: Emergency Medicine

## 2022-03-08 ENCOUNTER — Other Ambulatory Visit: Payer: Self-pay

## 2022-03-08 DIAGNOSIS — Z20822 Contact with and (suspected) exposure to covid-19: Secondary | ICD-10-CM | POA: Insufficient documentation

## 2022-03-08 DIAGNOSIS — J069 Acute upper respiratory infection, unspecified: Secondary | ICD-10-CM | POA: Insufficient documentation

## 2022-03-08 LAB — CBC WITH DIFFERENTIAL/PLATELET
Abs Immature Granulocytes: 0.03 10*3/uL (ref 0.00–0.07)
Basophils Absolute: 0 10*3/uL (ref 0.0–0.1)
Basophils Relative: 0 %
Eosinophils Absolute: 0.1 10*3/uL (ref 0.0–0.5)
Eosinophils Relative: 2 %
HCT: 37.3 % (ref 36.0–46.0)
Hemoglobin: 12.3 g/dL (ref 12.0–15.0)
Immature Granulocytes: 1 %
Lymphocytes Relative: 23 %
Lymphs Abs: 1.5 10*3/uL (ref 0.7–4.0)
MCH: 29.8 pg (ref 26.0–34.0)
MCHC: 33 g/dL (ref 30.0–36.0)
MCV: 90.3 fL (ref 80.0–100.0)
Monocytes Absolute: 0.5 10*3/uL (ref 0.1–1.0)
Monocytes Relative: 7 %
Neutro Abs: 4.2 10*3/uL (ref 1.7–7.7)
Neutrophils Relative %: 67 %
Platelets: 230 10*3/uL (ref 150–400)
RBC: 4.13 MIL/uL (ref 3.87–5.11)
RDW: 12.2 % (ref 11.5–15.5)
WBC: 6.3 10*3/uL (ref 4.0–10.5)
nRBC: 0 % (ref 0.0–0.2)

## 2022-03-08 LAB — URINALYSIS, ROUTINE W REFLEX MICROSCOPIC
Bilirubin Urine: NEGATIVE
Glucose, UA: NEGATIVE mg/dL
Ketones, ur: NEGATIVE mg/dL
Nitrite: NEGATIVE
Protein, ur: 30 mg/dL — AB
Specific Gravity, Urine: 1.027 (ref 1.005–1.030)
pH: 5 (ref 5.0–8.0)

## 2022-03-08 LAB — COMPREHENSIVE METABOLIC PANEL
ALT: 10 U/L (ref 0–44)
AST: 22 U/L (ref 15–41)
Albumin: 4 g/dL (ref 3.5–5.0)
Alkaline Phosphatase: 75 U/L (ref 38–126)
Anion gap: 7 (ref 5–15)
BUN: 10 mg/dL (ref 8–23)
CO2: 22 mmol/L (ref 22–32)
Calcium: 9 mg/dL (ref 8.9–10.3)
Chloride: 109 mmol/L (ref 98–111)
Creatinine, Ser: 1.23 mg/dL — ABNORMAL HIGH (ref 0.44–1.00)
GFR, Estimated: 49 mL/min — ABNORMAL LOW (ref >60)
Glucose, Bld: 118 mg/dL — ABNORMAL HIGH (ref 70–99)
Potassium: 3.6 mmol/L (ref 3.5–5.1)
Sodium: 138 mmol/L (ref 135–145)
Total Bilirubin: 1.7 mg/dL — ABNORMAL HIGH (ref 0.3–1.2)
Total Protein: 7.4 g/dL (ref 6.5–8.1)

## 2022-03-08 LAB — RESP PANEL BY RT-PCR (FLU A&B, COVID) ARPGX2
Influenza A by PCR: NEGATIVE
Influenza B by PCR: NEGATIVE
SARS Coronavirus 2 by RT PCR: NEGATIVE

## 2022-03-08 LAB — LIPASE, BLOOD: Lipase: 34 U/L (ref 11–51)

## 2022-03-08 MED ORDER — NAPROXEN 500 MG PO TABS: 500.0000 mg | ORAL_TABLET | Freq: Two times a day (BID) | ORAL | 2 refills | Status: DC

## 2022-03-08 MED ORDER — BENZONATATE 100 MG PO CAPS: 100.0000 mg | ORAL_CAPSULE | Freq: Four times a day (QID) | ORAL | 0 refills | Status: AC | PRN

## 2022-03-08 NOTE — ED Triage Notes (Signed)
Presents with cough    States she is having lower abd pain with cough   Denies any fever or n/v

## 2022-03-08 NOTE — ED Provider Notes (Signed)
[facility] Provider Note    Event Date/Time   First MD Initiated Contact with Patient 03/08/22 [phone]     (approximate)   History   Cough   HPI  Lauren Riggs is a 61 y.o. female with no significant past medical history who presents with complaints of cough.  Patient reports he has been coughing for 3 days, she reports today it seems to be significantly better.  She reports her abdominal wall is sore from coughing.  No shortness of breath.  Has not take anything for this.    Physical Exam   Triage Vital Signs: ED Triage Vitals  Enc Vitals Group     BP 03/08/22 0822 115/77     Pulse Rate 03/08/22 0822 (!) 105     Resp 03/08/22 0822 20     Temp --      Temp src --      SpO2 03/08/22 0822 98 %     Weight 03/08/22 0819 58.5 kg (128 lb 15.5 oz)     Height 03/08/22 0819 1.626 m (5\' 4" )     Head Circumference --      Peak Flow --      Pain Score --      Pain Loc --      Pain Edu? --      Excl. in [place]? --     Most recent vital signs: Vitals:   03/08/22 0822  BP: 115/77  Pulse: (!) 105  Resp: 20  SpO2: 98%     General: Awake, no distress.  CV:  Good peripheral perfusion.  Resp:  Normal effort.  Abd:  No distention.  Soft, mild abdominal wall discomfort and palpitation and flexion Other:     ED Results / Procedures / Treatments   Labs (all labs ordered are listed, but only abnormal results are displayed) Labs Reviewed  COMPREHENSIVE METABOLIC PANEL - Abnormal; Notable for the following components:      Result Value   Glucose, Bld 118 (*)    Creatinine, Ser 1.23 (*)    Total Bilirubin 1.7 (*)    GFR, Estimated 49 (*)    All other components within normal limits  URINALYSIS, ROUTINE W REFLEX MICROSCOPIC - Abnormal; Notable for the following components:   Color, Urine AMBER (*)    APPearance CLOUDY (*)    Hgb urine dipstick SMALL (*)    Protein, ur 30 (*)    Leukocytes,Ua TRACE (*)    Bacteria, UA RARE (*)    All other  components within normal limits  RESP PANEL BY RT-PCR (FLU A&B, COVID) ARPGX2  LIPASE, BLOOD  CBC WITH DIFFERENTIAL/PLATELET     EKG     RADIOLOGY     PROCEDURES:  Critical Care performed:   Procedures   MEDICATIONS ORDERED IN ED: Medications - No data to display   IMPRESSION / MDM / [place] / ED COURSE  I reviewed the triage vital signs and the nursing notes. Patient's presentation is most consistent with acute illness / injury with system symptoms.   Patient presents with cough as noted above now with some abdominal discomfort.  Easily replicable with abdominal wall flexion, mild palpitation.  Suspect abdominal wall strain from coughing.  Lab work reviewed and is quite reassuring.  No indication for admission or further workup at this time, will treat with NSAIDs, Tessalon Perles.  Outpatient follow-up as needed.       FINAL CLINICAL IMPRESSION(S) / ED DIAGNOSES  Final diagnoses:  Viral URI with cough     Rx / DC Orders   ED Discharge Orders          Ordered    naproxen (NAPROSYN) 500 MG tablet  2 times daily with meals        03/08/22 0923    benzonatate (TESSALON PERLES) 100 MG capsule  Every 6 hours PRN        03/08/22 3748             Note:  This document was prepared using Dragon voice recognition software and may include unintentional dictation errors.   Jene Every, MD 03/08/22 1026

## 2022-03-08 NOTE — ED Provider Triage Note (Signed)
Emergency Medicine Provider Triage Evaluation Note  Lauren Riggs , a 61 y.o. female  was evaluated in triage.  Pt complains of cough and abdominal pain x 3 days.  Review of Systems  Positive:  Negative:   Physical Exam  Ht 5\' 4"  (1.626 m)   Wt 58.5 kg   BMI 22.14 kg/m  Gen:   Awake, no distress   Resp:  Normal effort  MSK:   Moves extremities without difficulty  Other:    Medical Decision Making  Medically screening exam initiated at 8:21 AM.  Appropriate orders placed.  Lauren Riggs was informed that the remainder of the evaluation will be completed by another provider, this initial triage assessment does not replace that evaluation, and the importance of remaining in the ED until their evaluation is complete.     Althea Grimmer, PA-C 03/08/22 [phone]

## 2022-05-13 ENCOUNTER — Other Ambulatory Visit: Payer: Self-pay | Admitting: Family Medicine

## 2022-05-13 DIAGNOSIS — Z1231 Encounter for screening mammogram for malignant neoplasm of breast: Secondary | ICD-10-CM

## 2022-06-16 ENCOUNTER — Ambulatory Visit
Admission: RE | Admit: 2022-06-16 | Discharge: 2022-06-16 | Disposition: A | Discharge status: Home / Self Care | Payer: Medicaid Other | Source: Ambulatory Visit | Attending: Family Medicine | Admitting: Family Medicine

## 2022-06-16 DIAGNOSIS — Z1231 Encounter for screening mammogram for malignant neoplasm of breast: Secondary | ICD-10-CM | POA: Diagnosis present

## 2022-06-20 ENCOUNTER — Encounter: Payer: Self-pay | Admitting: Family Medicine

## 2022-06-23 ENCOUNTER — Other Ambulatory Visit: Payer: Self-pay | Admitting: Family Medicine

## 2022-06-23 DIAGNOSIS — N6489 Other specified disorders of breast: Secondary | ICD-10-CM

## 2022-06-23 DIAGNOSIS — R928 Other abnormal and inconclusive findings on diagnostic imaging of breast: Secondary | ICD-10-CM

## 2022-06-28 ENCOUNTER — Ambulatory Visit
Admission: RE | Admit: 2022-06-28 | Discharge: 2022-06-28 | Disposition: A | Discharge status: Home / Self Care | Payer: Medicaid Other | Source: Ambulatory Visit | Attending: Family Medicine | Admitting: Family Medicine

## 2022-06-28 DIAGNOSIS — N6489 Other specified disorders of breast: Secondary | ICD-10-CM

## 2022-06-28 DIAGNOSIS — R928 Other abnormal and inconclusive findings on diagnostic imaging of breast: Secondary | ICD-10-CM | POA: Diagnosis not present

## 2022-07-20 ENCOUNTER — Other Ambulatory Visit: Payer: Self-pay

## 2022-07-20 ENCOUNTER — Emergency Department
Admission: EM | Admit: 2022-07-20 | Discharge: 2022-07-20 | Disposition: A | Discharge status: Home / Self Care | Payer: Medicaid Other | Attending: Emergency Medicine | Admitting: Emergency Medicine

## 2022-07-20 DIAGNOSIS — R55 Syncope and collapse: Secondary | ICD-10-CM | POA: Diagnosis not present

## 2022-07-20 DIAGNOSIS — R42 Dizziness and giddiness: Secondary | ICD-10-CM | POA: Diagnosis present

## 2022-07-20 DIAGNOSIS — I10 Essential (primary) hypertension: Secondary | ICD-10-CM | POA: Insufficient documentation

## 2022-07-20 DIAGNOSIS — Z8673 Personal history of transient ischemic attack (TIA), and cerebral infarction without residual deficits: Secondary | ICD-10-CM | POA: Insufficient documentation

## 2022-07-20 LAB — COMPREHENSIVE METABOLIC PANEL
ALT: 11 U/L (ref 0–44)
AST: 19 U/L (ref 15–41)
Albumin: 4 g/dL (ref 3.5–5.0)
Alkaline Phosphatase: 102 U/L (ref 38–126)
Anion gap: 7 (ref 5–15)
BUN: 9 mg/dL (ref 8–23)
CO2: 24 mmol/L (ref 22–32)
Calcium: 9.3 mg/dL (ref 8.9–10.3)
Chloride: 109 mmol/L (ref 98–111)
Creatinine, Ser: 1.02 mg/dL — ABNORMAL HIGH (ref 0.44–1.00)
GFR, Estimated: >60 mL/min (ref >60)
Glucose, Bld: 102 mg/dL — ABNORMAL HIGH (ref 70–99)
Potassium: 3.7 mmol/L (ref 3.5–5.1)
Sodium: 140 mmol/L (ref 135–145)
Total Bilirubin: 1.1 mg/dL (ref 0.3–1.2)
Total Protein: 7.5 g/dL (ref 6.5–8.1)

## 2022-07-20 LAB — CBC WITH DIFFERENTIAL/PLATELET
Abs Immature Granulocytes: 0.02 10*3/uL (ref 0.00–0.07)
Basophils Absolute: 0 10*3/uL (ref 0.0–0.1)
Basophils Relative: 1 %
Eosinophils Absolute: 0.3 10*3/uL (ref 0.0–0.5)
Eosinophils Relative: 4 %
HCT: 37.1 % (ref 36.0–46.0)
Hemoglobin: 12 g/dL (ref 12.0–15.0)
Immature Granulocytes: 0 %
Lymphocytes Relative: 35 %
Lymphs Abs: 2.1 10*3/uL (ref 0.7–4.0)
MCH: 30 pg (ref 26.0–34.0)
MCHC: 32.3 g/dL (ref 30.0–36.0)
MCV: 92.8 fL (ref 80.0–100.0)
Monocytes Absolute: 0.5 10*3/uL (ref 0.1–1.0)
Monocytes Relative: 8 %
Neutro Abs: 3.1 10*3/uL (ref 1.7–7.7)
Neutrophils Relative %: 52 %
Platelets: 275 10*3/uL (ref 150–400)
RBC: 4 MIL/uL (ref 3.87–5.11)
RDW: 12.4 % (ref 11.5–15.5)
WBC: 6 10*3/uL (ref 4.0–10.5)
nRBC: 0 % (ref 0.0–0.2)

## 2022-07-20 NOTE — ED Triage Notes (Signed)
Pt states that since Sunday she has been having a lot of dizziness and feeling off balance, pt also becomes tearful and states that her child's father passed away 2 weeks ago and she hasn't felt right since, that she keeps feeling like she is going to fall over, pt denies headaches or blurred vision, pt reports both hands were numb yesterday

## 2022-07-20 NOTE — Discharge Instructions (Signed)
Your blood work including your blood counts and electrolytes were reassuring.  Please make sure you are staying hydrated.  If you develop any new symptoms that are concerning to return to the emergency department or follow-up with primary care.

## 2022-07-20 NOTE — ED Provider Notes (Signed)
[facility] Provider Note    Event Date/Time   First MD Initiated Contact with Patient 07/20/22 1335     (approximate)   History   Dizziness   HPI  Lauren Riggs is a 62 y.o. female with history of hypertension TIA presents with intermittent lightheadedness/dizziness.  Patient tells me that since Sunday, over the last 3 days she does not feel like herself.  Has noted intermittent lightheadedness feeling she said to pass out.  Typically occurs when she is standing.  Denies vision change numbness tingling weakness other than she had some bilateral hand cramping/numbness yesterday which resolved after she took the hands out.  Denies chest pain shortness of breath palpitations.  No fevers chills cough abdominal pain vomiting diarrhea or urinary symptoms.  Patient's child's father passed away 2 weeks ago and has been very stressful for her.  She thinks it could be stress related.     Past Medical History:  Diagnosis Date   HTN (hypertension)    TIA (transient ischemic attack)     Patient Active Problem List   Diagnosis Date Noted   HTN (hypertension) 02/03/2022   TIA (transient ischemic attack) 02/03/2022     Physical Exam  Triage Vital Signs: ED Triage Vitals  Enc Vitals Group     BP 07/20/22 1323 127/75     Pulse Rate 07/20/22 1323 65     Resp 07/20/22 1323 16     Temp 07/20/22 1323 97.8 F (36.6 C)     Temp Source 07/20/22 1323 Oral     SpO2 07/20/22 1323 98 %     Weight 07/20/22 1323 140 lb (63.5 kg)     Height 07/20/22 1323 5\' 4"  (1.626 m)     Head Circumference --      Peak Flow --      Pain Score 07/20/22 1329 0     Pain Loc --      Pain Edu? --      Excl. in GC? --     Most recent vital signs: Vitals:   07/20/22 1323  BP: 127/75  Pulse: 65  Resp: 16  Temp: 97.8 F (36.6 C)  SpO2: 98%     General: Awake, no distress. CV:  Good peripheral perfusion.  Resp:  Normal effort.  Abd:  No distention.  Neuro:              Awake, Alert, Oriented x 3  Other:  Patient is tearful Aox3, nml speech  PERRL, EOMI, face symmetric, nml tongue movement  5/5 strength in the BL upper and lower extremities  Sensation grossly intact in the BL upper and lower extremities  Finger-nose-finger intact BL    ED Results / Procedures / Treatments  Labs (all labs ordered are listed, but only abnormal results are displayed) Labs Reviewed  COMPREHENSIVE METABOLIC PANEL - Abnormal; Notable for the following components:      Result Value   Glucose, Bld 102 (*)    Creatinine, Ser 1.02 (*)    All other components within normal limits  CBC WITH DIFFERENTIAL/PLATELET     EKG  EKG interpretation performed by myself: NSR, nml axis, nml intervals, no acute ischemic changes   RADIOLOGY    PROCEDURES:  Critical Care performed: No  Procedures   MEDICATIONS ORDERED IN ED: Medications - No data to display   IMPRESSION / MDM / ASSESSMENT AND PLAN / ED COURSE  I reviewed the triage vital signs and the nursing notes.  Patient's presentation is most consistent with acute complicated illness / injury requiring diagnostic workup.  Differential diagnosis includes, but is not limited to, grief reaction, anemia, electrode abnormality, dehydration, vasovagal syncope, orthostatic presyncope, low suspicion for central vertigo  Patient is a 62 year old female who presents with intermittent lightheadedness/dizziness.  This been going on for several days.  Typically feels it when she is standing up.  Also just complains of not feeling like herself.  However she denies any vertiginous spinning, diplopia numbness tingling weakness.  She has no ischemic symptoms including no chest pain dyspnea or palpitations.  Patient is tearful and recently lost the father of her son.  She feels like this could be related to her symptoms.  Patient's vital signs are reassuring normal blood pressure.  His neurologic exam is  normal including normal gait and finger-to-nose no findings to suggest stroke or TIA or posterior stroke.  Patient's EKG shows normal rhythm no ischemia check basic labs including CBC and CMP to screen for anemia or other process that could be causing lightheadedness and these are normal.  Ultimately my suspicion for acute life-threatening process is low.  Patient says has not been eating and drinking as well could be component of dehydration but patient tolerating p.o. not feel that she needs fluids at this time.  Provided reassurance and recommended she follow-up with PCP.      FINAL CLINICAL IMPRESSION(S) / ED DIAGNOSES   Final diagnoses:  Near syncope     Rx / DC Orders   ED Discharge Orders     None        Note:  This document was prepared using Dragon voice recognition software and may include unintentional dictation errors.   Georga Hacking, MD 07/20/22 [phone]

## 2023-08-28 ENCOUNTER — Ambulatory Visit: Payer: Medicaid Other | Admitting: Dermatology

## 2023-08-28 DIAGNOSIS — B079 Viral wart, unspecified: Secondary | ICD-10-CM

## 2023-08-28 DIAGNOSIS — D492 Neoplasm of unspecified behavior of bone, soft tissue, and skin: Secondary | ICD-10-CM | POA: Diagnosis not present

## 2023-08-28 DIAGNOSIS — D485 Neoplasm of uncertain behavior of skin: Secondary | ICD-10-CM

## 2023-08-28 DIAGNOSIS — L821 Other seborrheic keratosis: Secondary | ICD-10-CM

## 2023-08-28 NOTE — Patient Instructions (Addendum)

## 2023-08-28 NOTE — Progress Notes (Signed)
   New Patient Visit   Subjective  Lauren Riggs is a 63 y.o. female who presents for the following:  Bump on the left lateral eye x 1 year, growing for the past 3-4 months.    The following portions of the chart were reviewed this encounter and updated as appropriate: medications, allergies, medical history  Review of Systems:  No other skin or systemic complaints except as noted in HPI or Assessment and Plan.  Objective  Well appearing patient in no apparent distress; mood and affect are within normal limits.  A focused examination was performed of the following areas: Face  Relevant physical exam findings are noted in the Assessment and Plan.  Left Lateral Upper Eyelid 3 mm flesh slightly waxy papule   Assessment & Plan   NEOPLASM OF UNCERTAIN BEHAVIOR OF SKIN Left Lateral Upper Eyelid Epidermal / dermal shaving  Lesion diameter (cm):  0.3 Informed consent: discussed and consent obtained   Patient was prepped and draped in usual sterile fashion: Area prepped with alcohol. Anesthesia: the lesion was anesthetized in a standard fashion   Anesthetic:  1% lidocaine  w/ epinephrine 1-100,000 buffered w/ 8.4% NaHCO3 Instrument used: scissors   Hemostasis achieved with: pressure, aluminum chloride and electrodesiccation   Outcome: patient tolerated procedure well   Post-procedure details: wound care instructions given   Post-procedure details comment:  Ointment and small bandage applied Specimen 1 - Surgical pathology Differential Diagnosis: Irritated SK vs Skin Tag vs Wart Check Margins: No  SEBORRHEIC KERATOSIS - Stuck-on, waxy, tan-brown papules periocular - Benign-appearing - Discussed benign etiology and prognosis. - Observe - Call for any changes  Return if symptoms worsen or fail to improve.  IBernardine Bridegroom, CMA, am acting as scribe for Artemio Larry, MD .   Documentation: I have reviewed the above documentation for accuracy and completeness, and I agree with  the above.  Artemio Larry, MD

## 2023-08-29 ENCOUNTER — Other Ambulatory Visit: Payer: Self-pay

## 2023-08-29 ENCOUNTER — Telehealth: Payer: Self-pay

## 2023-08-29 DIAGNOSIS — Z1211 Encounter for screening for malignant neoplasm of colon: Secondary | ICD-10-CM

## 2023-08-29 MED ORDER — SUTAB 1479-225-188 MG PO TABS: 12.0000 | ORAL_TABLET | Freq: Two times a day (BID) | ORAL | 0 refills | Status: AC

## 2023-08-29 NOTE — Telephone Encounter (Signed)
 Gastroenterology Pre-Procedure Review  Request Date: 09/27/23 Requesting Physician: Dr. Cornel Diesel  PATIENT REVIEW QUESTIONS: The patient responded to the following health history questions as indicated:    1. Are you having any GI issues? no 2. Do you have a personal history of Polyps? no 3. Do you have a family history of Colon Cancer or Polyps? no 4. Diabetes Mellitus? no 5. Joint replacements in the past 12 months?no 6. Major health problems in the past 3 months?no 7. Any artificial heart valves, MVP, or defibrillator?no    MEDICATIONS & ALLERGIES:    Patient reports the following regarding taking any anticoagulation/antiplatelet therapy:   Plavix, Coumadin, Eliquis, Xarelto, Lovenox, Pradaxa, Brilinta, or Effient? no Aspirin ? no  Patient confirms/reports the following medications:  Current Outpatient Medications  Medication Sig Dispense Refill   Sodium Sulfate-Mag Sulfate-KCl (SUTAB) [phone] MG TABS Take 12 tablets by mouth 2 (two) times daily for 1 day. 24 tablet 0   Vitamin D, Ergocalciferol, (DRISDOL) 1.25 MG (50000 UNIT) CAPS capsule take one po weekly for 12 weeks and then resume daily vitamin d     acyclovir (ZOVIRAX) 200 MG capsule Take 200 mg by mouth 5 (five) times daily.     amLODipine  (NORVASC ) 5 MG tablet Take 1 tablet (5 mg total) by mouth daily. 30 tablet 1   aspirin  EC 81 MG tablet Take 81 mg by mouth daily. Swallow whole.     No current facility-administered medications for this visit.    Patient confirms/reports the following allergies:  Allergies  Allergen Reactions   Phenergan [Promethazine Hcl] Other (See Comments)    lethargy    No orders of the defined types were placed in this encounter.   AUTHORIZATION INFORMATION Primary Insurance: 1D#: Group #:  Secondary Insurance: 1D#: Group #:  SCHEDULE INFORMATION: Date: 09/27/23 Time: Location: [facility]

## 2023-08-31 LAB — SURGICAL PATHOLOGY

## 2023-09-07 ENCOUNTER — Encounter: Payer: Self-pay | Admitting: Family Medicine

## 2023-09-11 ENCOUNTER — Ambulatory Visit: Payer: Self-pay | Admitting: Dermatology

## 2023-09-11 NOTE — Telephone Encounter (Signed)
-----   Message from Artemio Larry sent at 09/11/2023  3:06 PM EDT ----- 1. Skin, left lateral upper eyelid :       VERRUCA VULGARIS   Benign wart - please call patient

## 2023-09-11 NOTE — Telephone Encounter (Signed)
 Advised patient biopsy was a benign wart, no further treatment needed.

## 2023-09-20 ENCOUNTER — Encounter: Payer: Self-pay | Admitting: General Surgery

## 2023-09-27 ENCOUNTER — Ambulatory Visit: Admitting: Anesthesiology

## 2023-09-27 ENCOUNTER — Encounter: Payer: Self-pay | Admitting: General Surgery

## 2023-09-27 ENCOUNTER — Encounter
Admission: RE | Disposition: A | Discharge status: Home / Self Care | Payer: Self-pay | Source: Home / Self Care | Attending: General Surgery

## 2023-09-27 ENCOUNTER — Ambulatory Visit
Admission: RE | Admit: 2023-09-27 | Discharge: 2023-09-27 | Disposition: A | Discharge status: Home / Self Care | Attending: General Surgery | Admitting: General Surgery

## 2023-09-27 ENCOUNTER — Other Ambulatory Visit: Payer: Self-pay

## 2023-09-27 DIAGNOSIS — Z1211 Encounter for screening for malignant neoplasm of colon: Secondary | ICD-10-CM | POA: Diagnosis not present

## 2023-09-27 DIAGNOSIS — I10 Essential (primary) hypertension: Secondary | ICD-10-CM | POA: Insufficient documentation

## 2023-09-27 DIAGNOSIS — D124 Benign neoplasm of descending colon: Secondary | ICD-10-CM | POA: Diagnosis not present

## 2023-09-27 HISTORY — PX: COLONOSCOPY: SHX5424

## 2023-09-27 HISTORY — PX: POLYPECTOMY: SHX149

## 2023-09-27 SURGERY — COLONOSCOPY
Anesthesia: General

## 2023-09-27 MED ORDER — PROPOFOL 500 MG/50ML IV EMUL
INTRAVENOUS | Status: DC | PRN
  Administered 2023-09-27: 100 ug/kg/min via INTRAVENOUS

## 2023-09-27 MED ORDER — MIDAZOLAM HCL 2 MG/2ML IJ SOLN
INTRAMUSCULAR | Status: DC | PRN
  Administered 2023-09-27: 1 mg via INTRAVENOUS

## 2023-09-27 MED ORDER — MIDAZOLAM HCL 2 MG/2ML IJ SOLN
INTRAMUSCULAR | Status: AC
  Filled 2023-09-27: qty 2

## 2023-09-27 MED ORDER — FENTANYL CITRATE (PF) 100 MCG/2ML IJ SOLN
INTRAMUSCULAR | Status: AC
  Filled 2023-09-27: qty 2

## 2023-09-27 MED ORDER — SODIUM CHLORIDE 0.9 % IV SOLN
INTRAVENOUS | Status: DC
  Administered 2023-09-27: 500 mL via INTRAVENOUS

## 2023-09-27 MED ORDER — PROPOFOL 10 MG/ML IV BOLUS
INTRAVENOUS | Status: DC | PRN
  Administered 2023-09-27: 70 mg via INTRAVENOUS

## 2023-09-27 NOTE — Op Note (Addendum)
 [facility] Gastroenterology Patient Name: Lauren Riggs Procedure Date: 09/27/2023 8:01 AM MRN: [id number] Account #: [id number] Date of Birth: 1961/03/15 Admit Type: Outpatient Age: 63 Room: [facility] ENDO ROOM 1 Gender: Female Note Status: Supervisor Override Instrument Name: Arvis [id number] Procedure:             Colonoscopy Indications:           Screening for colorectal malignant neoplasm Providers:             Jayson KIDD. Marinda, MD Referring MD:          Jayson KIDD. Marinda, MD (Referring MD) Medicines:             See the Anesthesia note for documentation of the                         administered medications Complications:         No immediate complications. Estimated blood loss:                         Minimal. Procedure:             Pre-Anesthesia Assessment:                        - Prior to the procedure, a History and Physical was                         performed, and patient medications and allergies were                         reviewed. The patient's tolerance of previous                         anesthesia was also reviewed. The risks and benefits                         of the procedure and the sedation options and risks                         were discussed with the patient. All questions were                         answered, and informed consent was obtained. Prior                         Anticoagulants: The patient has taken no anticoagulant                         or antiplatelet agents. ASA Grade Assessment: II - A                         patient with mild systemic disease. After reviewing                         the risks and benefits, the patient was deemed in                         satisfactory condition to undergo the procedure.  After obtaining informed consent, the colonoscope was                         passed under direct vision. Throughout the procedure,                         the patient's blood pressure,  pulse, and oxygen                         saturations were monitored continuously. The                         Colonoscope was introduced through the anus and                         advanced to the the ileocecal valve. The colonoscopy                         was performed without difficulty. The patient                         tolerated the procedure well. The quality of the bowel                         preparation was poor. Findings:      The perianal and digital rectal examinations were normal.      A 2 mm polyp was found in the distal sigmoid colon. Biopsies were taken       with a cold forceps for histology.      The exam was otherwise without abnormality. Impression:            - Preparation of the colon was poor.                        - One 2 mm polyp in the distal sigmoid colon. Biopsied.                        - The examination was otherwise normal.                        - The exam was suboptimal due to patient preparation. Recommendation:        - Discharge patient to home. Procedure Code(s):     --- Professional ---                        [phone], Colonoscopy, flexible; with biopsy, single or                         multiple CPT copyright 2022 American Medical Association. All rights reserved. The codes documented in this report are preliminary and upon coder review may  be revised to meet current compliance requirements. Jayson MALVA Endow, MD 09/27/2023 9:04:56 AM Number of Addenda: 0 Note Initiated On: 09/27/2023 8:01 AM Scope Withdrawal Time: 0 hours 25 minutes 23 seconds  Total Procedure Duration: 0 hours 45 minutes 13 seconds       [facility]

## 2023-09-27 NOTE — Anesthesia Postprocedure Evaluation (Signed)
 Anesthesia Post Note  Patient: Lauren Riggs  Procedure(s) Performed: COLONOSCOPY POLYPECTOMY, INTESTINE  Patient location during evaluation: Endoscopy Anesthesia Type: General Level of consciousness: awake and alert Pain management: pain level controlled Vital Signs Assessment: post-procedure vital signs reviewed and stable Respiratory status: spontaneous breathing, nonlabored ventilation, respiratory function stable and patient connected to nasal cannula oxygen Cardiovascular status: blood pressure returned to baseline and stable Postop Assessment: no apparent nausea or vomiting Anesthetic complications: no   No notable events documented.   Last Vitals:  Vitals:   09/27/23 0917 09/27/23 0927  BP: (!) 105/58 (!) 104/58  Pulse: (!) 55 (!) 56  Resp: 13 15  Temp:    SpO2: 100% 100%    Last Pain:  Vitals:   09/27/23 0927  TempSrc:   PainSc: 0-No pain                 Nancey Awkward

## 2023-09-27 NOTE — Anesthesia Preprocedure Evaluation (Addendum)
 Anesthesia Evaluation  Patient identified by MRN, date of birth, ID band Patient awake    Reviewed: Allergy & Precautions, NPO status , Patient's Chart, lab work & pertinent test results  History of Anesthesia Complications Negative for: history of anesthetic complications  Airway Mallampati: III  TM Distance: >3 FB Neck ROM: full    Dental  (+) Chipped   Pulmonary neg pulmonary ROS   Pulmonary exam normal        Cardiovascular hypertension, On Medications Normal cardiovascular exam     Neuro/Psych TIA negative psych ROS   GI/Hepatic negative GI ROS, Neg liver ROS,,,  Endo/Other  negative endocrine ROS    Renal/GU negative Renal ROS  negative genitourinary   Musculoskeletal   Abdominal   Peds  Hematology negative hematology ROS (+)   Anesthesia Other Findings Past Medical History: No date: HTN (hypertension) No date: TIA (transient ischemic attack)  Past Surgical History: No date: BREAST BIOPSY; Left     Comment:  neg     Reproductive/Obstetrics negative OB ROS                              Anesthesia Physical Anesthesia Plan  ASA: 2  Anesthesia Plan: General   Post-op Pain Management: Minimal or no pain anticipated   Induction: Intravenous  PONV Risk Score and Plan: 2 and Propofol infusion and TIVA  Airway Management Planned: Natural Airway and Nasal Cannula  Additional Equipment:   Intra-op Plan:   Post-operative Plan:   Informed Consent: I have reviewed the patients History and Physical, chart, labs and discussed the procedure including the risks, benefits and alternatives for the proposed anesthesia with the patient or authorized representative who has indicated his/her understanding and acceptance.     Dental Advisory Given  Plan Discussed with: Anesthesiologist, CRNA and Surgeon  Anesthesia Plan Comments: (Patient consented for risks of anesthesia  including but not limited to:  - adverse reactions to medications - risk of airway placement if required - damage to eyes, teeth, lips or other oral mucosa - nerve damage due to positioning  - sore throat or hoarseness - Damage to heart, brain, nerves, lungs, other parts of body or loss of life  Patient voiced understanding and assent.)         Anesthesia Quick Evaluation

## 2023-09-27 NOTE — H&P (Signed)
 Primary Care Physician:  Dionicia Frater, MD Primary Gastroenterologist:  Dr. Cornel Diesel  Pre-Procedure History & Physical: HPI:  Lauren Riggs is a 63 y.o. female is here for an colonoscopy.   Past Medical History:  Diagnosis Date   HTN (hypertension)    TIA (transient ischemic attack)     Past Surgical History:  Procedure Laterality Date   BREAST BIOPSY Left    neg    Prior to Admission medications   Medication Sig Start Date End Date Taking? Authorizing Provider  amLODipine  (NORVASC ) 5 MG tablet Take 1 tablet (5 mg total) by mouth daily. 12/15/17 09/27/23 Yes Bryson Carbine, MD  aspirin  EC 81 MG tablet Take 81 mg by mouth daily. Swallow whole.   Yes [provider]  acyclovir (ZOVIRAX) 200 MG capsule Take 200 mg by mouth 5 (five) times daily.    [provider]  Vitamin D, Ergocalciferol, (DRISDOL) 1.25 MG (50000 UNIT) CAPS capsule take one po weekly for 12 weeks and then resume daily vitamin d 08/15/23   [provider]    Allergies as of 08/29/2023 - Review Complete 08/29/2023  Allergen Reaction Noted   Phenergan [promethazine hcl] Other (See Comments) 10/31/2015    History reviewed. No pertinent family history.  Social History   Socioeconomic History   Marital status: Single    Spouse name: Not on file   Number of children: Not on file   Years of education: Not on file   Highest education level: Not on file  Occupational History   Not on file  Tobacco Use   Smoking status: Never   Smokeless tobacco: Never  Vaping Use   Vaping status: Never Used  Substance and Sexual Activity   Alcohol use: No   Drug use: No   Sexual activity: Yes    Birth control/protection: None  Other Topics Concern   Not on file  Social History Narrative   Not on file   Social Drivers of Health   Financial Resource Strain: Not on file  Food Insecurity: Not on file  Transportation Needs: Not on file  Physical Activity: Not on file  Stress: Not on file   Social Connections: Not on file  Intimate Partner Violence: Not At Risk (02/03/2022)   Humiliation, Afraid, Rape, and Kick questionnaire    Fear of Current or Ex-Partner: No    Emotionally Abused: No    Physically Abused: No    Sexually Abused: No    Review of Systems: See HPI, otherwise negative ROS  Physical Exam: There were no vitals taken for this visit. General:   Alert,  pleasant and cooperative in NAD Head:  Normocephalic and atraumatic. Neck:  Supple; no masses or thyromegaly. Lungs:  Clear throughout to auscultation.    Heart:  Regular rate and rhythm. Abdomen:  Soft, nontender and nondistended. Normal bowel sounds, without guarding, and without rebound.   Neurologic:  Alert and  oriented x4;  grossly normal neurologically.  Impression/Plan: Lauren Riggs is here for an colonoscopy to be performed for screening  Risks, benefits, limitations, and alternatives regarding  colonoscopy have been reviewed with the patient.  Questions have been answered.  All parties agreeable.   [name], MD  09/27/2023, 7:18 AM

## 2023-09-27 NOTE — Discharge Instructions (Signed)
YOU HAD AN ENDOSCOPIC PROCEDURE TODAY: Refer to the procedure report that was given to you for any specific questions about what was found during the examination.  If the procedure report does not answer your questions, please call your gastroenterologist to clarify. ° °YOU SHOULD EXPECT: Some feelings of bloating in the abdomen. Passage of more gas than usual.  Walking can help get rid of the air that was put into your GI tract during the procedure and reduce the bloating. If you had a lower endoscopy (such as a colonoscopy or flexible sigmoidoscopy) you may notice spotting of blood in your stool or on the toilet paper.  ° °DIET: Your first meal following the procedure should be a light meal and then it is ok to progress to your normal diet.  A half-sandwich or bowl of soup is an example of a good first meal.  Heavy or fried foods are harder to digest and may make you feel nasueas or bloated.  Drink plenty of fluids but you should avoid alcoholic beverages for 24 hours. ° °ACTIVITY: Your care partner should take you home directly after the procedure.  You should plan to take it easy, moving slowly for the rest of the day.  You can resume normal activity the day after the procedure however you should NOT DRIVE, make legal decisions or use heavy machinery for 24 hours (because of the sedation medicines used during the test).   ° °SYMPTOMS TO REPORT IMMEDIATELY  °A gastroenterologist can be reached at any hour.  Please call your doctor's office for any of the following symptoms: ° °· Following lower endoscopy (colonoscopy, flexible sigmoidoscopy) ° Excessive amounts of blood in the stool ° Significant tenderness, worsening of abdominal pains ° Swelling of the abdomen that is new, acute ° Fever of 100° or higher °· Following upper endoscopy (EGD, EUS, ERCP) ° Vomiting of blood or coffee ground material ° New, significant abdominal pain ° New, significant chest pain or pain under the shoulder blades ° Painful or  persistently difficult swallowing ° New shortness of breath ° Black, tarry-looking stools ° °FOLLOW UP: °If any biopsies were taken you will be contacted by phone or by letter within the next 1-3 weeks.  Call your gastroenterologist if you have not heard about the biopsies in 3 weeks.  ° °Please also call your gastroenterologist's office with any specific questions about appointments or follow up tests. °

## 2023-09-27 NOTE — Transfer of Care (Signed)
 Immediate Anesthesia Transfer of Care Note  Patient: Lauren Riggs  Procedure(s) Performed: COLONOSCOPY POLYPECTOMY, INTESTINE  Patient Location: PACU  Anesthesia Type:General  Level of Consciousness: drowsy  Airway & Oxygen Therapy: Patient Spontanous Breathing and Patient connected to nasal cannula oxygen  Post-op Assessment: Report given to RN, Post -op Vital signs reviewed and stable, and Patient moving all extremities  Post vital signs: Reviewed and stable  Last Vitals:  Vitals Value Taken Time  BP 97/58 09/27/23 09:07  Temp    Pulse 66 09/27/23 09:07  Resp 14 09/27/23 09:07  SpO2 100 % 09/27/23 09:07  Vitals shown include unfiled device data.  Last Pain:  Vitals:   09/27/23 0907  TempSrc:   PainSc: Asleep      Patients Stated Pain Goal: 7 (09/27/23 0727)  Complications: No notable events documented.

## 2023-09-28 LAB — SURGICAL PATHOLOGY

## 2023-09-29 ENCOUNTER — Other Ambulatory Visit: Payer: Self-pay | Admitting: Family Medicine

## 2023-09-29 DIAGNOSIS — Z1231 Encounter for screening mammogram for malignant neoplasm of breast: Secondary | ICD-10-CM

## 2023-10-09 ENCOUNTER — Encounter: Payer: Self-pay | Admitting: General Surgery

## 2023-10-17 ENCOUNTER — Ambulatory Visit
Admission: RE | Admit: 2023-10-17 | Discharge: 2023-10-17 | Disposition: A | Discharge status: Home / Self Care | Source: Ambulatory Visit | Attending: Family Medicine | Admitting: Family Medicine

## 2023-10-17 DIAGNOSIS — Z1231 Encounter for screening mammogram for malignant neoplasm of breast: Secondary | ICD-10-CM | POA: Diagnosis present

## 2023-11-24 IMAGING — CR DG ANKLE COMPLETE 3+V*L*
1 series · 3 of 3 positions shown · non-contrast
Comparison: None.

CLINICAL DATA: Fall, left ankle pain

EXAM:
LEFT ANKLE COMPLETE - 3+ VIEW

[Series 1: x ankle ap left · 0.14mm/px · 3 of 3 slices shown]
[im 1/3]
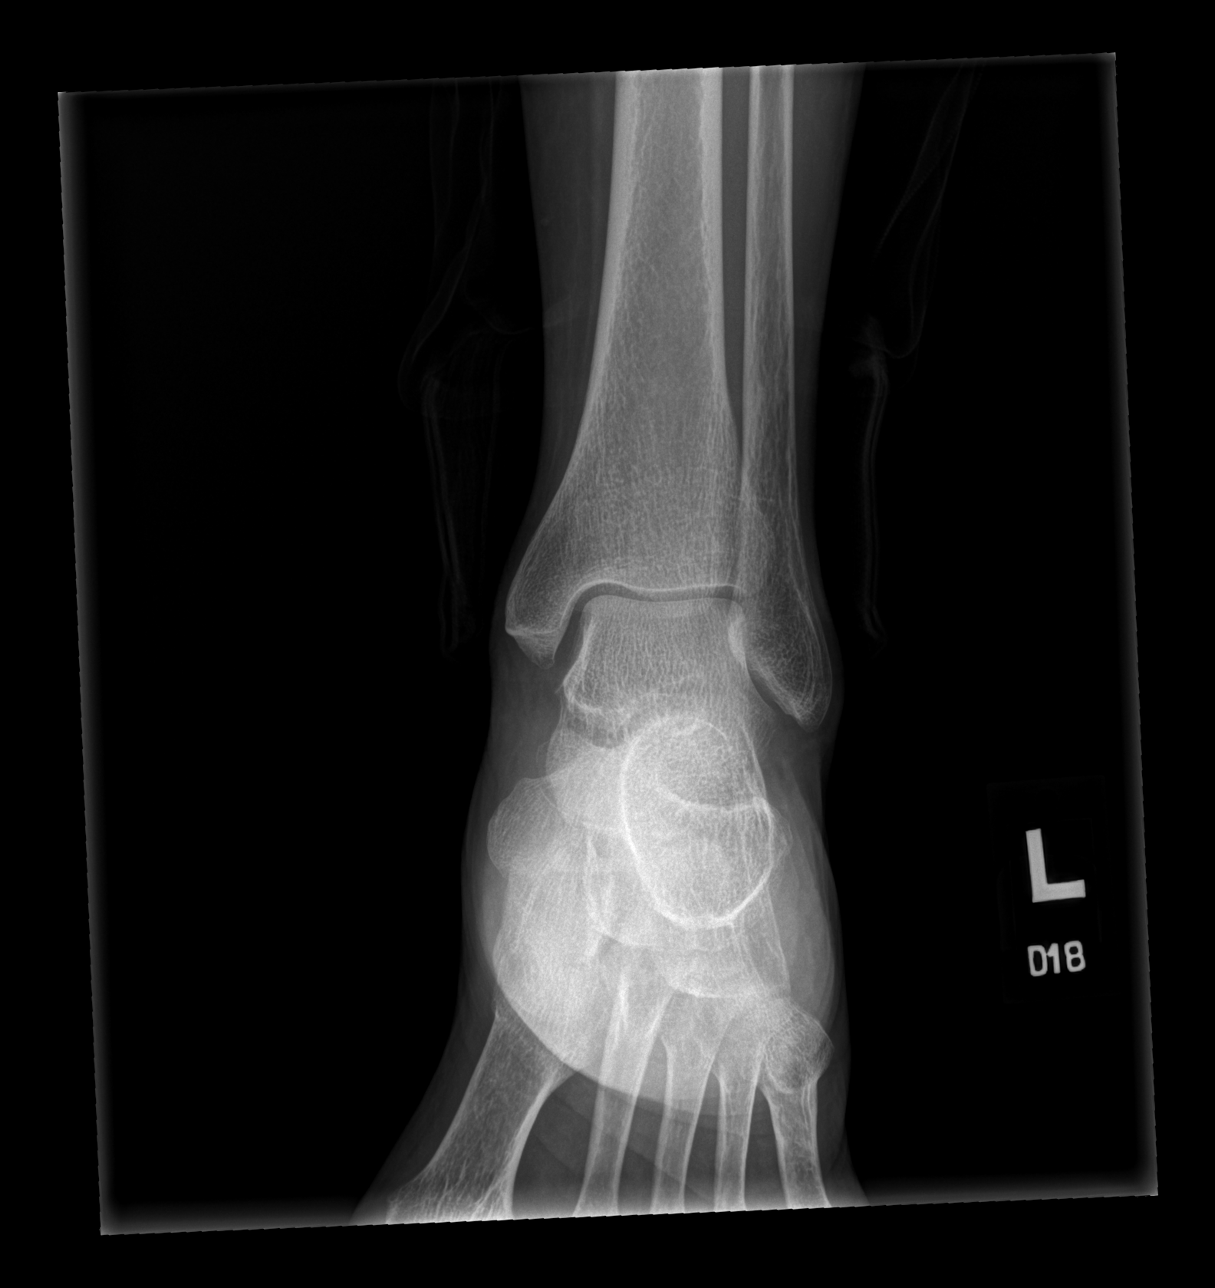
[im 2/3]
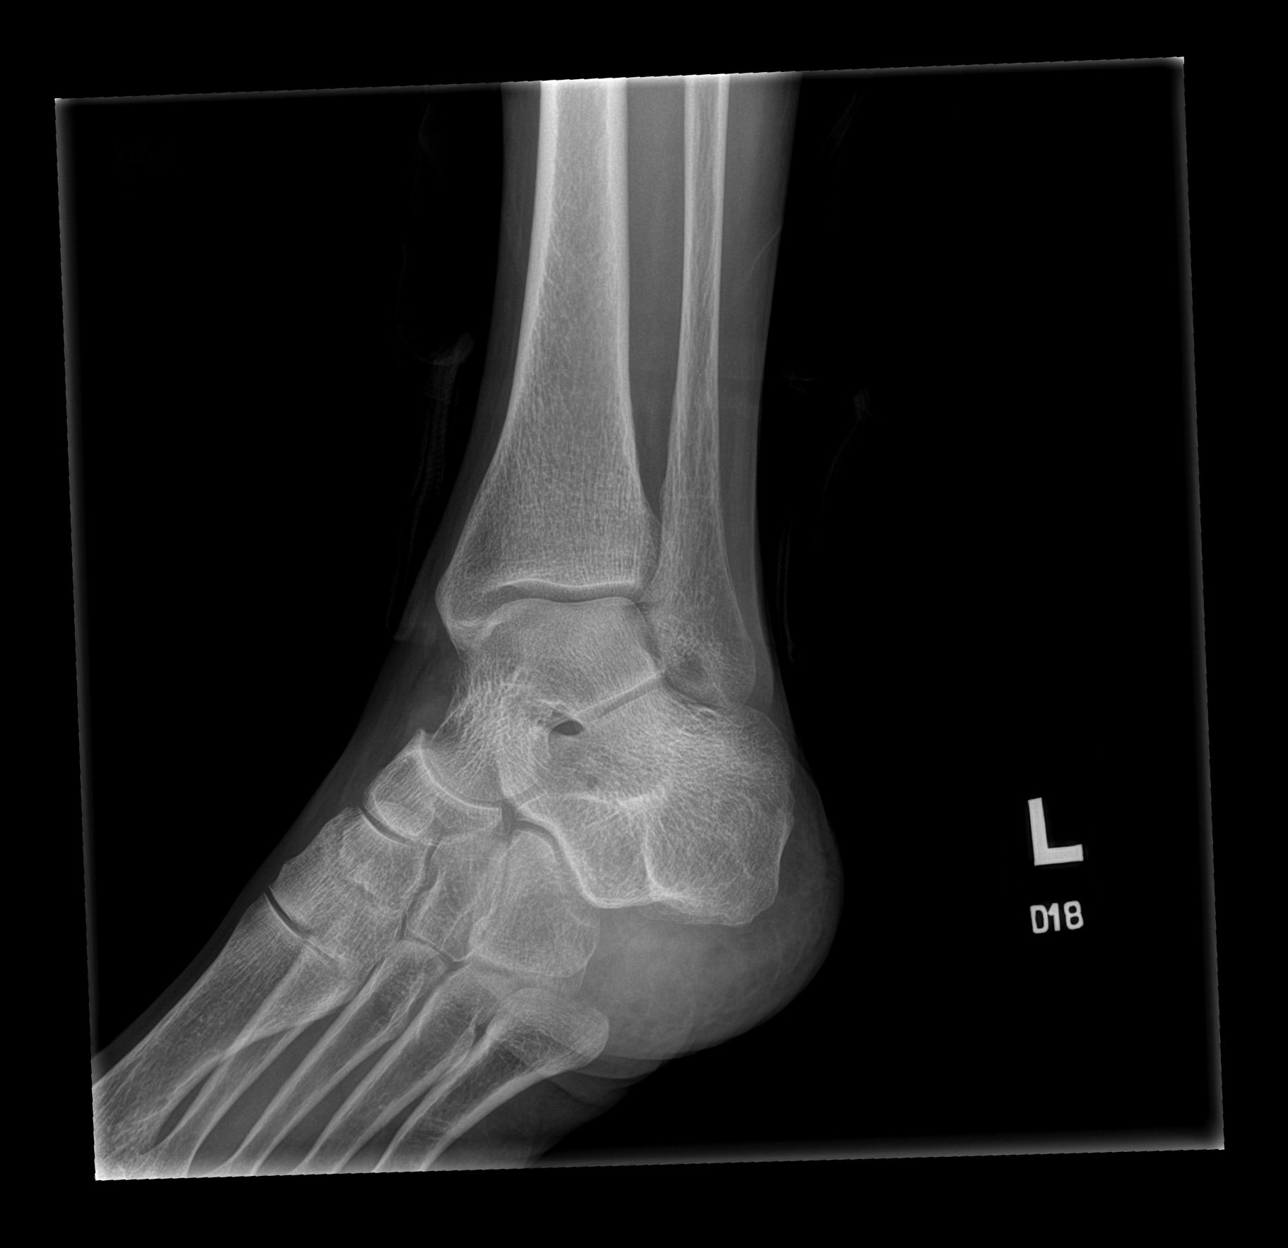
[im 3/3]
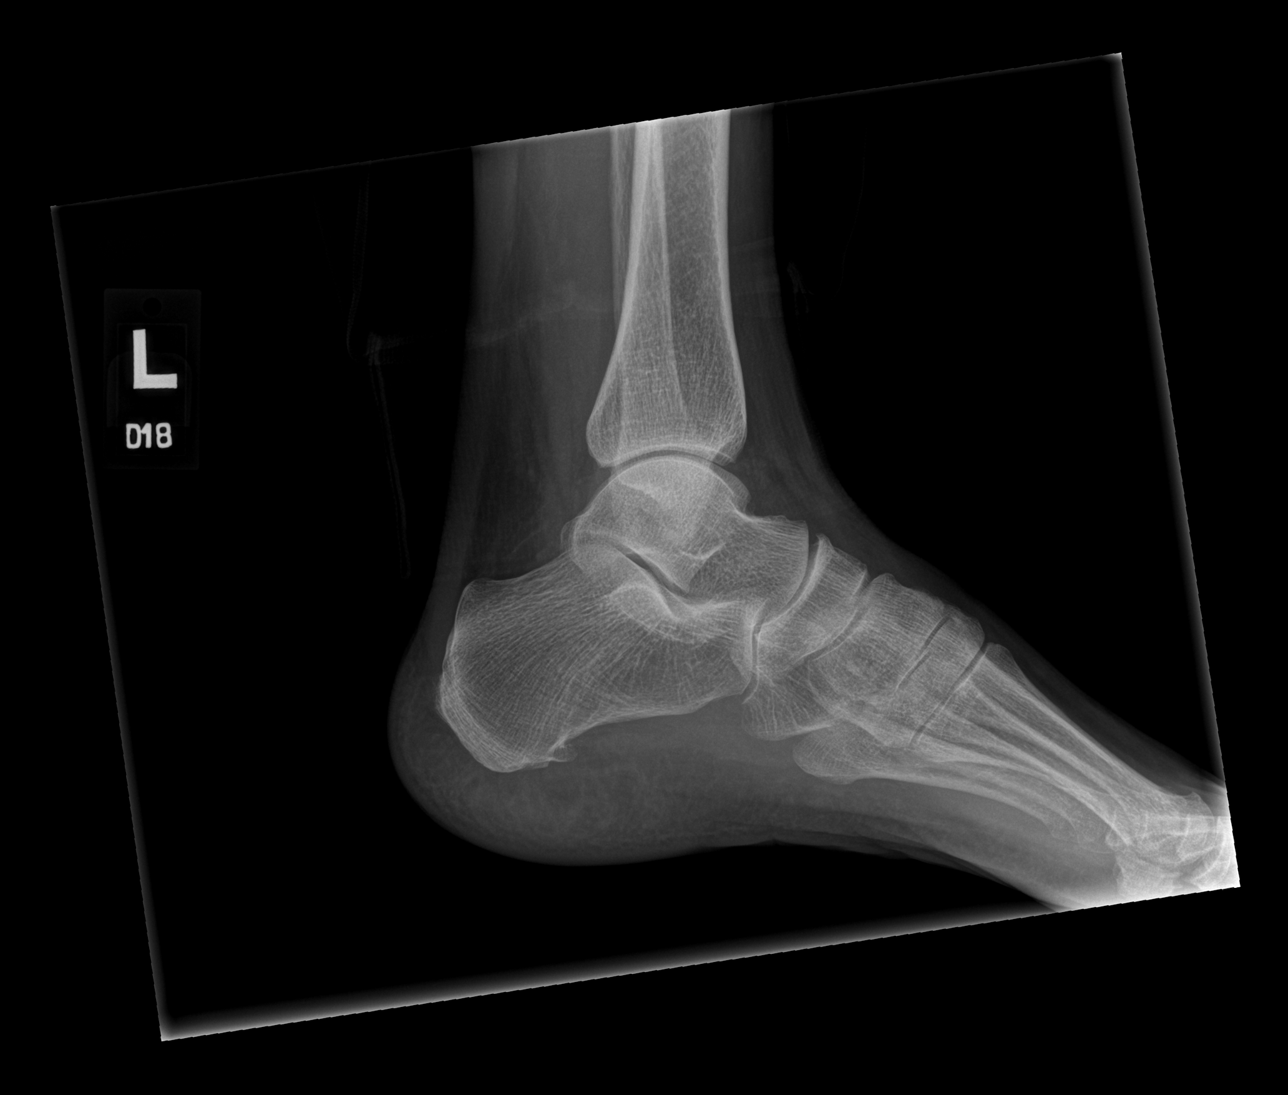

[3 of 3 positions shown; findings below may reference images not displayed]

FINDINGS: There is no evidence of fracture, dislocation, or joint effusion.
There is no evidence of arthropathy or other focal bone abnormality.
Soft tissues are unremarkable.
IMPRESSION: Negative.
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
## 2024-01-26 ENCOUNTER — Emergency Department
Admission: EM | Admit: 2024-01-26 | Discharge: 2024-01-26 | Disposition: A | Discharge status: Home / Self Care | Attending: Emergency Medicine | Admitting: Emergency Medicine

## 2024-01-26 ENCOUNTER — Other Ambulatory Visit: Payer: Self-pay

## 2024-01-26 DIAGNOSIS — I10 Essential (primary) hypertension: Secondary | ICD-10-CM | POA: Diagnosis not present

## 2024-01-26 DIAGNOSIS — K0889 Other specified disorders of teeth and supporting structures: Secondary | ICD-10-CM | POA: Diagnosis present

## 2024-01-26 DIAGNOSIS — K08409 Partial loss of teeth, unspecified cause, unspecified class: Secondary | ICD-10-CM | POA: Insufficient documentation

## 2024-01-26 MED ORDER — ONDANSETRON 4 MG PO TBDP
4.0000 mg | ORAL_TABLET | Freq: Once | ORAL | Status: AC
  Administered 2024-01-26: 4 mg via ORAL
  Filled 2024-01-26: qty 1

## 2024-01-26 MED ORDER — ONDANSETRON HCL 4 MG/2ML IJ SOLN
4.0000 mg | Freq: Once | INTRAMUSCULAR | Status: DC
  Filled 2024-01-26: qty 2

## 2024-01-26 MED ORDER — HYDROCODONE-ACETAMINOPHEN 7.5-325 MG/15ML PO SOLN
10.0000 mL | Freq: Once | ORAL | Status: AC
  Administered 2024-01-26: 10 mL via ORAL
  Filled 2024-01-26: qty 15

## 2024-01-26 MED ORDER — SODIUM CHLORIDE 0.9 % IV BOLUS: 500.0000 mL | Freq: Once | INTRAVENOUS | Status: DC

## 2024-01-26 NOTE — ED Notes (Signed)
 This RN attempted IV access x 2 without success. Pt refused anymore sticks, even by other Rns. Jenise Menshew, PA notified.

## 2024-01-26 NOTE — Discharge Instructions (Signed)
 Take the previously prescribed antibiotic as directed.  Take OTC children's Tylenol  and Motrin suspensions, as needed for pain relief.  Eat soft foods and drink plenty of liquids until you can advance your diet.  Follow-up with your primary provider or dental provider for ongoing evaluation.

## 2024-01-26 NOTE — ED Provider Notes (Signed)
 [facility] Emergency Department Provider Note     Event Date/Time   First MD Initiated Contact with Patient 01/26/24 1740     (approximate)   History   Dental Pain   HPI  Lauren Riggs is a 63 y.o. female with a history of HTN, and TIA, presents to the ED for evaluation of acute dental pain.  Patient presents via EMS from home, after she had 4 teeth pulled today by dental provider locally.  Patient reports she was not prescribed any postop pain medication, and by her report was told not to eat or drink anything for 24 hours.  Family reports she received a local anesthetic for the dental procedure, and had a possible syncopal episode today.  Patient lives independently, and called out to her family members who called EMS upon arrival.  Patient's sister reports patient was sitting on the couch when she arrived, active, alert, and engaged.  Sister also reports patient only had a hash brown all day today, prior to the procedure, denies any other p.o. intake since that time.  No reports any outright fall, weakness, paralysis, or nausea or vomiting.  Patient denies any fever, chills, or bleeding from the mouth.   Physical Exam   Triage Vital Signs: ED Triage Vitals  Encounter Vitals Group     BP 01/26/24 1722 106/60     Girls Systolic BP Percentile --      Girls Diastolic BP Percentile --      Boys Systolic BP Percentile --      Boys Diastolic BP Percentile --      Pulse Rate 01/26/24 1722 70     Resp 01/26/24 1722 18     Temp 01/26/24 1722 98.7 F (37.1 C)     Temp Source 01/26/24 1722 Axillary     SpO2 01/26/24 1722 100 %     Weight --      Height --      Head Circumference --      Peak Flow --      Pain Score 01/26/24 1720 9     Pain Loc --      Pain Education --      Exclude from Growth Chart --     Most recent vital signs: Vitals:   01/26/24 1722 01/26/24 1907  BP: 106/60   Pulse: 70   Resp: 18   Temp: 98.7 F (37.1 C)   SpO2: 100%  100%    General Awake, no distress. NAD HEENT NCAT. PERRL. EOMI. No rhinorrhea. Mucous membranes are moist.  CV:  Good peripheral perfusion. RRR RESP:  Normal effort.  MSK:  AROM NEURO: Cranial nerves II to XII grossly intact  ED Results / Procedures / Treatments   Labs (all labs ordered are listed, but only abnormal results are displayed) Labs Reviewed - No data to display   EKG   RADIOLOGY  No results found.   PROCEDURES:  Critical Care performed: No  Procedures   MEDICATIONS ORDERED IN ED: Medications  ondansetron  (ZOFRAN -ODT) disintegrating tablet 4 mg (4 mg Oral Given by Other 01/26/24 1852)  HYDROcodone -acetaminophen  (HYCET) 7.5-325 mg/15 ml solution 10 mL (10 mLs Oral Given 01/26/24 1953)     IMPRESSION / MDM / ASSESSMENT AND PLAN / ED COURSE  I reviewed the triage vital signs and the nursing notes.  Differential diagnosis includes, but is not limited to, dental pain, dental caries, dental infection, dry socket, dehydration, hypoglycemia  Patient's presentation is most consistent with acute, uncomplicated illness.  Patient's diagnosis is consistent with dental pain and likely weakness and near syncope due to poor p.o. intake.  Patient presents to the ED via EMS in no acute distress.  She is active engaged, and able to give responses when asked.  Patient is holding gauze in her mouth with some minimal amount of bleeding noted due to upper incisor dental extraction.  Vital signs are stable and patient shows no to signs of acute dehydration toxic appearance.  Unsuccessful IV establishment due to likely dehydration.  Patient otherwise treated with p.o. antiemetics and pain medicine.  Patient will be discharged home with instructions to eat soft foods and drink plenty of fluids when she gets home.  She will also start previously prescribed antibiotic as directed. Patient is to follow up with her dental provider or PCP as needed or  otherwise directed. Patient is given ED precautions to return to the ED for any worsening or new symptoms.   FINAL CLINICAL IMPRESSION(S) / ED DIAGNOSES   Final diagnoses:  Pain, dental  History of tooth extraction, unspecified edentulism class     Rx / DC Orders   ED Discharge Orders     None        Note:  This document was prepared using Dragon voice recognition software and may include unintentional dictation errors.    Loyd Candida LULLA Aldona, PA-C 01/26/24 1958    Claudene Rover, MD 01/29/24 [phone]

## 2024-01-26 NOTE — ED Triage Notes (Signed)
 Family member states patient had 4 teeth pulled today and was not prescribed any pain medications.

## 2024-01-26 NOTE — ED Triage Notes (Signed)
 First nurse note: pt to ED [facility] from home for possible syncopal episode today, no fall after having 4 teeth pulled today. Received local anesthesia.

## 2024-04-11 ENCOUNTER — Other Ambulatory Visit: Payer: Self-pay

## 2024-04-11 ENCOUNTER — Emergency Department

## 2024-04-11 ENCOUNTER — Emergency Department
Admission: EM | Admit: 2024-04-11 | Discharge: 2024-04-11 | Disposition: A | Discharge status: Home / Self Care | Source: Home / Self Care

## 2024-04-11 ENCOUNTER — Encounter: Payer: Self-pay | Admitting: Intensive Care

## 2024-04-11 DIAGNOSIS — S161XXA Strain of muscle, fascia and tendon at neck level, initial encounter: Secondary | ICD-10-CM | POA: Diagnosis not present

## 2024-04-11 DIAGNOSIS — Y92410 Unspecified street and highway as the place of occurrence of the external cause: Secondary | ICD-10-CM | POA: Insufficient documentation

## 2024-04-11 DIAGNOSIS — S39012A Strain of muscle, fascia and tendon of lower back, initial encounter: Secondary | ICD-10-CM | POA: Insufficient documentation

## 2024-04-11 DIAGNOSIS — S199XXA Unspecified injury of neck, initial encounter: Secondary | ICD-10-CM | POA: Diagnosis present

## 2024-04-11 HISTORY — DX: Anemia, unspecified: D64.9

## 2024-04-11 MED ORDER — METHOCARBAMOL 500 MG PO TABS: 500.0000 mg | ORAL_TABLET | Freq: Three times a day (TID) | ORAL | 0 refills | Status: AC | PRN

## 2024-04-11 MED ORDER — IBUPROFEN 600 MG PO TABS
600.0000 mg | ORAL_TABLET | Freq: Once | ORAL | Status: AC
  Administered 2024-04-11: 600 mg via ORAL
  Filled 2024-04-11: qty 1

## 2024-04-11 NOTE — Discharge Instructions (Addendum)
 I have prescribed you a muscle relaxer to help with your pain.  Please be careful when taking this medication as it can make you very drowsy.  Do not drive or operate any heavy machinery.  You may also take Tylenol /Motrin as needed as well as use ice/heat.  Follow-up with your primary care physician within the next few days.  Return to the emergency department for new or worsening symptoms.
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
